# Patient Record
Sex: Male | Born: 1950 | Race: White | Hispanic: No | Marital: Married | Smoking: Never smoker
Health system: Southern US, Community
[De-identification: ages and names within clinical notes are randomized; demographics above are authoritative.]

## PROBLEM LIST (undated history)

## (undated) DIAGNOSIS — D229 Melanocytic nevi, unspecified: Secondary | ICD-10-CM

## (undated) DIAGNOSIS — R972 Elevated prostate specific antigen [PSA]: Secondary | ICD-10-CM

## (undated) DIAGNOSIS — L598 Other specified disorders of the skin and subcutaneous tissue related to radiation: Secondary | ICD-10-CM

## (undated) DIAGNOSIS — M272 Inflammatory conditions of jaws: Secondary | ICD-10-CM

## (undated) DIAGNOSIS — R5382 Chronic fatigue, unspecified: Secondary | ICD-10-CM

## (undated) DIAGNOSIS — N281 Cyst of kidney, acquired: Secondary | ICD-10-CM

## (undated) DIAGNOSIS — M1990 Unspecified osteoarthritis, unspecified site: Secondary | ICD-10-CM

## (undated) DIAGNOSIS — D4959 Neoplasm of unspecified behavior of other genitourinary organ: Secondary | ICD-10-CM

## (undated) DIAGNOSIS — H2510 Age-related nuclear cataract, unspecified eye: Secondary | ICD-10-CM

## (undated) DIAGNOSIS — H905 Unspecified sensorineural hearing loss: Secondary | ICD-10-CM

## (undated) DIAGNOSIS — E291 Testicular hypofunction: Secondary | ICD-10-CM

## (undated) DIAGNOSIS — D46C Myelodysplastic syndrome with isolated del(5q) chromosomal abnormality: Secondary | ICD-10-CM

## (undated) DIAGNOSIS — Y842 Radiological procedure and radiotherapy as the cause of abnormal reaction of the patient, or of later complication, without mention of misadventure at the time of the procedure: Secondary | ICD-10-CM

## (undated) DIAGNOSIS — N3941 Urge incontinence: Secondary | ICD-10-CM

## (undated) DIAGNOSIS — B370 Candidal stomatitis: Secondary | ICD-10-CM

## (undated) DIAGNOSIS — D099 Carcinoma in situ, unspecified: Secondary | ICD-10-CM

## (undated) DIAGNOSIS — H9313 Tinnitus, bilateral: Secondary | ICD-10-CM

## (undated) DIAGNOSIS — Z961 Presence of intraocular lens: Secondary | ICD-10-CM

## (undated) HISTORY — PX: ESOPHAGOSCOPY WITH DILITATION: SHX5618

---
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
## 2000-04-28 ENCOUNTER — Other Ambulatory Visit: Admission: RE | Admit: 2000-04-28 | Discharge: 2000-04-28 | Payer: Self-pay | Admitting: Specialist

## 2002-06-10 ENCOUNTER — Ambulatory Visit (HOSPITAL_BASED_OUTPATIENT_CLINIC_OR_DEPARTMENT_OTHER): Admission: RE | Admit: 2002-06-10 | Discharge: 2002-06-10 | Payer: Self-pay | Admitting: Orthopedic Surgery

## 2004-05-30 ENCOUNTER — Encounter: Admission: RE | Admit: 2004-05-30 | Discharge: 2004-05-30 | Payer: Self-pay | Admitting: Orthopedic Surgery

## 2008-02-06 ENCOUNTER — Ambulatory Visit: Payer: Self-pay | Admitting: Oncology

## 2008-02-26 LAB — CBC WITH DIFFERENTIAL/PLATELET
BASO%: 2 % (ref 0.0–2.0)
Basophils Absolute: 0.1 10*3/uL (ref 0.0–0.1)
EOS%: 5.8 % (ref 0.0–7.0)
HGB: 9.1 g/dL — ABNORMAL LOW (ref 13.0–17.1)
MCH: 40.2 pg — ABNORMAL HIGH (ref 28.0–33.4)
MONO#: 0.2 10*3/uL (ref 0.1–0.9)
RDW: 18 % — ABNORMAL HIGH (ref 11.2–14.6)
WBC: 5.1 10*3/uL (ref 4.0–10.0)
lymph#: 1.6 10*3/uL (ref 0.9–3.3)

## 2008-02-26 LAB — MORPHOLOGY: PLT EST: ADEQUATE

## 2008-02-28 ENCOUNTER — Ambulatory Visit (HOSPITAL_COMMUNITY): Admission: RE | Admit: 2008-02-28 | Discharge: 2008-02-28 | Payer: Self-pay | Admitting: Oncology

## 2008-02-28 ENCOUNTER — Encounter (HOSPITAL_COMMUNITY): Payer: Self-pay | Admitting: Oncology

## 2008-02-28 ENCOUNTER — Ambulatory Visit: Payer: Self-pay | Admitting: Oncology

## 2008-02-28 LAB — IMMUNOFIXATION ELECTROPHORESIS: IgG (Immunoglobin G), Serum: 1000 mg/dL (ref 694–1618)

## 2008-02-28 LAB — COMPREHENSIVE METABOLIC PANEL
ALT: 25 U/L (ref 0–53)
AST: 15 U/L (ref 0–37)
Albumin: 4.7 g/dL (ref 3.5–5.2)
Calcium: 9.6 mg/dL (ref 8.4–10.5)
Chloride: 104 mEq/L (ref 96–112)
Creatinine, Ser: 0.92 mg/dL (ref 0.40–1.50)
Potassium: 4.1 mEq/L (ref 3.5–5.3)

## 2008-02-28 LAB — KAPPA/LAMBDA LIGHT CHAINS: Kappa free light chain: 1.24 mg/dL (ref 0.33–1.94)

## 2008-03-07 LAB — CBC & DIFF AND RETIC
Basophils Absolute: 0.1 10*3/uL (ref 0.0–0.1)
HCT: 27.2 % — ABNORMAL LOW (ref 38.7–49.9)
HGB: 9.6 g/dL — ABNORMAL LOW (ref 13.0–17.1)
IRF: 0.42 — ABNORMAL HIGH (ref 0.070–0.380)
LYMPH%: 25.4 % (ref 14.0–48.0)
MONO#: 0.2 10*3/uL (ref 0.1–0.9)
NEUT%: 64.8 % (ref 40.0–75.0)
Platelets: 496 10*3/uL — ABNORMAL HIGH (ref 145–400)
WBC: 4.8 10*3/uL (ref 4.0–10.0)
lymph#: 1.2 10*3/uL (ref 0.9–3.3)

## 2008-03-07 LAB — ERYTHROCYTE SEDIMENTATION RATE: Sed Rate: 20 mm/hr (ref 0–20)

## 2008-03-13 LAB — HEMOCHROMATOSIS DNA-PCR(C282Y,H63D)

## 2008-03-18 ENCOUNTER — Ambulatory Visit: Payer: Self-pay | Admitting: Oncology

## 2008-03-21 LAB — CBC WITH DIFFERENTIAL/PLATELET
BASO%: 1.1 % (ref 0.0–2.0)
EOS%: 4.7 % (ref 0.0–7.0)
HCT: 27.9 % — ABNORMAL LOW (ref 38.7–49.9)
MCH: 40.1 pg — ABNORMAL HIGH (ref 28.0–33.4)
MCHC: 35.3 g/dL (ref 32.0–35.9)
NEUT%: 59.4 % (ref 40.0–75.0)
RBC: 2.45 10*6/uL — ABNORMAL LOW (ref 4.20–5.71)
RDW: 18 % — ABNORMAL HIGH (ref 11.2–14.6)
WBC: 4.3 10*3/uL (ref 4.0–10.0)
lymph#: 1.3 10*3/uL (ref 0.9–3.3)

## 2008-04-04 LAB — CBC WITH DIFFERENTIAL/PLATELET
Basophils Absolute: 0 10*3/uL (ref 0.0–0.1)
EOS%: 5 % (ref 0.0–7.0)
HCT: 23.4 % — ABNORMAL LOW (ref 38.7–49.9)
HGB: 8.2 g/dL — ABNORMAL LOW (ref 13.0–17.1)
MCH: 39.9 pg — ABNORMAL HIGH (ref 28.0–33.4)
MCV: 114.1 fL — ABNORMAL HIGH (ref 81.6–98.0)
MONO%: 3.7 % (ref 0.0–13.0)
NEUT%: 70.5 % (ref 40.0–75.0)
RDW: 17.4 % — ABNORMAL HIGH (ref 11.2–14.6)

## 2008-04-07 LAB — CBC WITH DIFFERENTIAL/PLATELET
EOS%: 4.7 % (ref 0.0–7.0)
MCH: 39.7 pg — ABNORMAL HIGH (ref 28.0–33.4)
MCV: 114 fL — ABNORMAL HIGH (ref 81.6–98.0)
MONO%: 5.4 % (ref 0.0–13.0)
RBC: 2.2 10*6/uL — ABNORMAL LOW (ref 4.20–5.71)
RDW: 17.8 % — ABNORMAL HIGH (ref 11.2–14.6)

## 2008-04-07 LAB — COMPREHENSIVE METABOLIC PANEL
ALT: 33 U/L (ref 0–53)
Alkaline Phosphatase: 64 U/L (ref 39–117)
Glucose, Bld: 101 mg/dL — ABNORMAL HIGH (ref 70–99)
Sodium: 137 mEq/L (ref 135–145)
Total Bilirubin: 0.3 mg/dL (ref 0.3–1.2)
Total Protein: 7.2 g/dL (ref 6.0–8.3)

## 2008-04-07 LAB — TECHNOLOGIST REVIEW

## 2008-04-11 LAB — CBC WITH DIFFERENTIAL/PLATELET
Basophils Absolute: 0 10*3/uL (ref 0.0–0.1)
Eosinophils Absolute: 0.3 10*3/uL (ref 0.0–0.5)
HGB: 8.6 g/dL — ABNORMAL LOW (ref 13.0–17.1)
LYMPH%: 32.7 % (ref 14.0–48.0)
MCV: 113.8 fL — ABNORMAL HIGH (ref 81.6–98.0)
MONO#: 0.2 10*3/uL (ref 0.1–0.9)
MONO%: 3.4 % (ref 0.0–13.0)
NEUT#: 2.9 10*3/uL (ref 1.5–6.5)
Platelets: 750 10*3/uL — ABNORMAL HIGH (ref 145–400)
RBC: 2.16 10*6/uL — ABNORMAL LOW (ref 4.20–5.71)
RDW: 17.9 % — ABNORMAL HIGH (ref 11.2–14.6)
WBC: 5 10*3/uL (ref 4.0–10.0)

## 2008-05-13 ENCOUNTER — Ambulatory Visit: Payer: Self-pay | Admitting: Oncology

## 2008-05-15 LAB — CBC WITH DIFFERENTIAL/PLATELET
Basophils Absolute: 0.6 10*3/uL — ABNORMAL HIGH (ref 0.0–0.1)
HCT: 29.6 % — ABNORMAL LOW (ref 38.7–49.9)
HGB: 10.7 g/dL — ABNORMAL LOW (ref 13.0–17.1)
MONO#: 0.2 10*3/uL (ref 0.1–0.9)
NEUT#: 1.9 10*3/uL (ref 1.5–6.5)
NEUT%: 40.3 % (ref 40.0–75.0)
RDW: 18.9 % — ABNORMAL HIGH (ref 11.2–14.6)
WBC: 4.7 10*3/uL (ref 4.0–10.0)
lymph#: 1.4 10*3/uL (ref 0.9–3.3)

## 2008-05-19 ENCOUNTER — Ambulatory Visit: Payer: Self-pay | Admitting: Oncology

## 2008-05-21 ENCOUNTER — Ambulatory Visit (HOSPITAL_COMMUNITY): Admission: RE | Admit: 2008-05-21 | Discharge: 2008-05-21 | Payer: Self-pay | Admitting: Oncology

## 2008-05-21 LAB — CBC WITH DIFFERENTIAL/PLATELET
Basophils Absolute: 0.5 10*3/uL — ABNORMAL HIGH (ref 0.0–0.1)
Eosinophils Absolute: 0.4 10*3/uL (ref 0.0–0.5)
HGB: 11.3 g/dL — ABNORMAL LOW (ref 13.0–17.1)
LYMPH%: 12.1 % — ABNORMAL LOW (ref 14.0–48.0)
MCV: 104 fL — ABNORMAL HIGH (ref 81.6–98.0)
MONO#: 0.3 10*3/uL (ref 0.1–0.9)
MONO%: 3.4 % (ref 0.0–13.0)
NEUT#: 7.2 10*3/uL — ABNORMAL HIGH (ref 1.5–6.5)
Platelets: 460 10*3/uL — ABNORMAL HIGH (ref 145–400)
RDW: 18.8 % — ABNORMAL HIGH (ref 11.2–14.6)
WBC: 9.7 10*3/uL (ref 4.0–10.0)

## 2008-05-21 LAB — BASIC METABOLIC PANEL
CO2: 28 mEq/L (ref 19–32)
Calcium: 9.5 mg/dL (ref 8.4–10.5)
Chloride: 98 mEq/L (ref 96–112)
Glucose, Bld: 131 mg/dL — ABNORMAL HIGH (ref 70–99)
Sodium: 134 mEq/L — ABNORMAL LOW (ref 135–145)

## 2008-05-21 LAB — TECHNOLOGIST REVIEW

## 2008-05-23 ENCOUNTER — Encounter (HOSPITAL_COMMUNITY): Payer: Self-pay | Admitting: Oncology

## 2008-05-23 ENCOUNTER — Inpatient Hospital Stay (HOSPITAL_COMMUNITY): Admission: AD | Admit: 2008-05-23 | Discharge: 2008-05-27 | Payer: Self-pay | Admitting: Oncology

## 2008-05-29 LAB — CBC WITH DIFFERENTIAL/PLATELET
BASO%: 16.3 % — ABNORMAL HIGH (ref 0.0–2.0)
Basophils Absolute: 0.7 10*3/uL — ABNORMAL HIGH (ref 0.0–0.1)
Eosinophils Absolute: 0.4 10*3/uL (ref 0.0–0.5)
HCT: 26.2 % — ABNORMAL LOW (ref 38.7–49.9)
HGB: 9.2 g/dL — ABNORMAL LOW (ref 13.0–17.1)
LYMPH%: 34.6 % (ref 14.0–48.0)
MCHC: 35.1 g/dL (ref 32.0–35.9)
MONO#: 0.1 10*3/uL (ref 0.1–0.9)
NEUT#: 1.4 10*3/uL — ABNORMAL LOW (ref 1.5–6.5)
NEUT%: 35.5 % — ABNORMAL LOW (ref 40.0–75.0)
Platelets: 500 10*3/uL — ABNORMAL HIGH (ref 145–400)
WBC: 4.1 10*3/uL (ref 4.0–10.0)
lymph#: 1.4 10*3/uL (ref 0.9–3.3)

## 2008-06-12 LAB — CBC WITH DIFFERENTIAL/PLATELET
Basophils Absolute: 0.6 10*3/uL — ABNORMAL HIGH (ref 0.0–0.1)
EOS%: 15.2 % — ABNORMAL HIGH (ref 0.0–7.0)
Eosinophils Absolute: 0.8 10*3/uL — ABNORMAL HIGH (ref 0.0–0.5)
HCT: 28.6 % — ABNORMAL LOW (ref 38.7–49.9)
HGB: 10 g/dL — ABNORMAL LOW (ref 13.0–17.1)
MCH: 37.1 pg — ABNORMAL HIGH (ref 28.0–33.4)
MCV: 107 fL — ABNORMAL HIGH (ref 81.6–98.0)
MONO%: 3.9 % (ref 0.0–13.0)
NEUT#: 1.8 10*3/uL (ref 1.5–6.5)
NEUT%: 36.2 % — ABNORMAL LOW (ref 40.0–75.0)
RDW: 19.4 % — ABNORMAL HIGH (ref 11.2–14.6)

## 2008-06-19 LAB — CBC WITH DIFFERENTIAL/PLATELET
Basophils Absolute: 0.3 10*3/uL — ABNORMAL HIGH (ref 0.0–0.1)
Eosinophils Absolute: 0.4 10*3/uL (ref 0.0–0.5)
HGB: 10.2 g/dL — ABNORMAL LOW (ref 13.0–17.1)
LYMPH%: 30.7 % (ref 14.0–48.0)
MCV: 107 fL — ABNORMAL HIGH (ref 81.6–98.0)
MONO#: 0.1 10*3/uL (ref 0.1–0.9)
MONO%: 4.5 % (ref 0.0–13.0)
NEUT#: 1.1 10*3/uL — ABNORMAL LOW (ref 1.5–6.5)
Platelets: 252 10*3/uL (ref 145–400)
RDW: 18.8 % — ABNORMAL HIGH (ref 11.2–14.6)
WBC: 2.8 10*3/uL — ABNORMAL LOW (ref 4.0–10.0)

## 2008-06-19 LAB — TECHNOLOGIST REVIEW

## 2008-06-26 LAB — CBC WITH DIFFERENTIAL/PLATELET
BASO%: 12.7 % — ABNORMAL HIGH (ref 0.0–2.0)
Basophils Absolute: 0.4 10*3/uL — ABNORMAL HIGH (ref 0.0–0.1)
HCT: 32.1 % — ABNORMAL LOW (ref 38.7–49.9)
HGB: 11.3 g/dL — ABNORMAL LOW (ref 13.0–17.1)
LYMPH%: 31 % (ref 14.0–48.0)
MCHC: 35.1 g/dL (ref 32.0–35.9)
MONO#: 0.3 10*3/uL (ref 0.1–0.9)
NEUT%: 40 % (ref 40.0–75.0)
Platelets: 253 10*3/uL (ref 145–400)
WBC: 3.3 10*3/uL — ABNORMAL LOW (ref 4.0–10.0)
lymph#: 1 10*3/uL (ref 0.9–3.3)

## 2008-06-26 LAB — COMPREHENSIVE METABOLIC PANEL
BUN: 13 mg/dL (ref 6–23)
CO2: 26 mEq/L (ref 19–32)
Calcium: 9.3 mg/dL (ref 8.4–10.5)
Chloride: 103 mEq/L (ref 96–112)
Creatinine, Ser: 1.02 mg/dL (ref 0.40–1.50)
Glucose, Bld: 76 mg/dL (ref 70–99)
Total Bilirubin: 0.5 mg/dL (ref 0.3–1.2)

## 2008-06-26 LAB — LACTATE DEHYDROGENASE: LDH: 170 U/L (ref 94–250)

## 2008-06-27 ENCOUNTER — Ambulatory Visit (HOSPITAL_COMMUNITY): Admission: RE | Admit: 2008-06-27 | Discharge: 2008-06-27 | Payer: Self-pay | Admitting: Oncology

## 2008-07-08 ENCOUNTER — Ambulatory Visit: Payer: Self-pay | Admitting: Oncology

## 2008-07-10 LAB — CBC WITH DIFFERENTIAL/PLATELET
HGB: 13.1 g/dL (ref 13.0–17.1)
MCV: 109.1 fL — ABNORMAL HIGH (ref 81.6–98.0)
RBC: 3.47 10*6/uL — ABNORMAL LOW (ref 4.20–5.71)
RDW: 18.8 % — ABNORMAL HIGH (ref 11.2–14.6)
WBC: 3.4 10*3/uL — ABNORMAL LOW (ref 4.0–10.0)

## 2008-07-10 LAB — MANUAL DIFFERENTIAL
ALC: 1.2 10*3/uL (ref 0.9–3.3)
Band Neutrophils: 0 % (ref 0–10)
Blasts: 0 % (ref 0–0)
EOS: 6 % (ref 0–7)
Myelocytes: 0 % (ref 0–0)
Other Cell: 0 % (ref 0–0)
PROMYELO: 0 % (ref 0–0)
SEG: 45 % (ref 38–77)
Variant Lymph: 0 % (ref 0–0)
nRBC: 0 % (ref 0–0)

## 2008-07-28 ENCOUNTER — Ambulatory Visit (HOSPITAL_COMMUNITY): Admission: RE | Admit: 2008-07-28 | Discharge: 2008-07-28 | Payer: Self-pay | Admitting: Oncology

## 2008-07-29 LAB — COMPREHENSIVE METABOLIC PANEL
ALT: 43 U/L (ref 0–53)
AST: 38 U/L — ABNORMAL HIGH (ref 0–37)
Albumin: 4.6 g/dL (ref 3.5–5.2)
Alkaline Phosphatase: 43 U/L (ref 39–117)
BUN: 15 mg/dL (ref 6–23)
CO2: 26 mEq/L (ref 19–32)
Calcium: 9.4 mg/dL (ref 8.4–10.5)
Chloride: 103 mEq/L (ref 96–112)
Creatinine, Ser: 0.96 mg/dL (ref 0.40–1.50)
Glucose, Bld: 101 mg/dL — ABNORMAL HIGH (ref 70–99)
Potassium: 4.2 mEq/L (ref 3.5–5.3)
Sodium: 140 mEq/L (ref 135–145)
Total Bilirubin: 0.7 mg/dL (ref 0.3–1.2)
Total Protein: 7.3 g/dL (ref 6.0–8.3)

## 2008-07-29 LAB — CBC WITH DIFFERENTIAL/PLATELET
BASO%: 5.3 % — ABNORMAL HIGH (ref 0.0–2.0)
Basophils Absolute: 0.2 10*3/uL — ABNORMAL HIGH (ref 0.0–0.1)
EOS%: 5.8 % (ref 0.0–7.0)
HCT: 37.7 % — ABNORMAL LOW (ref 38.7–49.9)
HGB: 13.4 g/dL (ref 13.0–17.1)
LYMPH%: 41.5 % (ref 14.0–48.0)
MCH: 36.6 pg — ABNORMAL HIGH (ref 28.0–33.4)
MCHC: 35.5 g/dL (ref 32.0–35.9)
MCV: 103 fL — ABNORMAL HIGH (ref 81.6–98.0)
MONO%: 7.3 % (ref 0.0–13.0)
NEUT%: 40 % (ref 40.0–75.0)
Platelets: 258 10*3/uL (ref 145–400)
lymph#: 1.4 10*3/uL (ref 0.9–3.3)

## 2008-07-29 LAB — TECHNOLOGIST REVIEW

## 2008-08-07 LAB — CBC WITH DIFFERENTIAL/PLATELET
BASO%: 1.1 % (ref 0.0–2.0)
LYMPH%: 33.3 % (ref 14.0–48.0)
MCHC: 34.8 g/dL (ref 32.0–35.9)
MONO#: 0.2 10*3/uL (ref 0.1–0.9)
MONO%: 6.1 % (ref 0.0–13.0)
NEUT#: 2.3 10*3/uL (ref 1.5–6.5)
Platelets: 200 10*3/uL (ref 145–400)
RBC: 3.66 10*6/uL — ABNORMAL LOW (ref 4.20–5.71)
RDW: 16.7 % — ABNORMAL HIGH (ref 11.2–14.6)
WBC: 4 10*3/uL (ref 4.0–10.0)

## 2008-08-07 LAB — TECHNOLOGIST REVIEW

## 2008-08-19 LAB — CBC WITH DIFFERENTIAL/PLATELET
BASO%: 7.8 % — ABNORMAL HIGH (ref 0.0–2.0)
EOS%: 5 % (ref 0.0–7.0)
HGB: 13.4 g/dL (ref 13.0–17.1)
MCH: 36 pg — ABNORMAL HIGH (ref 28.0–33.4)
MCHC: 35.6 g/dL (ref 32.0–35.9)
MONO#: 0.2 10*3/uL (ref 0.1–0.9)
RDW: 14.8 % — ABNORMAL HIGH (ref 11.2–14.6)
WBC: 4 10*3/uL (ref 4.0–10.0)
lymph#: 1.4 10*3/uL (ref 0.9–3.3)

## 2008-09-17 ENCOUNTER — Ambulatory Visit: Payer: Self-pay | Admitting: Oncology

## 2008-09-19 ENCOUNTER — Ambulatory Visit (HOSPITAL_COMMUNITY): Admission: RE | Admit: 2008-09-19 | Discharge: 2008-09-19 | Payer: Self-pay | Admitting: Oncology

## 2008-09-19 LAB — CBC WITH DIFFERENTIAL/PLATELET
Eosinophils Absolute: 0.1 10*3/uL (ref 0.0–0.5)
HCT: 40.7 % (ref 38.7–49.9)
LYMPH%: 38.3 % (ref 14.0–48.0)
MCV: 104.2 fL — ABNORMAL HIGH (ref 81.6–98.0)
MONO#: 0.2 10*3/uL (ref 0.1–0.9)
MONO%: 4.5 % (ref 0.0–13.0)
NEUT#: 2.4 10*3/uL (ref 1.5–6.5)
NEUT%: 53 % (ref 40.0–75.0)
Platelets: 201 10*3/uL (ref 145–400)
RBC: 3.91 10*6/uL — ABNORMAL LOW (ref 4.20–5.71)
WBC: 4.4 10*3/uL (ref 4.0–10.0)

## 2008-09-19 LAB — TECHNOLOGIST REVIEW

## 2008-10-14 LAB — COMPREHENSIVE METABOLIC PANEL
ALT: 31 U/L (ref 0–53)
AST: 23 U/L (ref 0–37)
Albumin: 4.5 g/dL (ref 3.5–5.2)
Calcium: 9.3 mg/dL (ref 8.4–10.5)
Chloride: 102 mEq/L (ref 96–112)
Potassium: 4.4 mEq/L (ref 3.5–5.3)
Total Protein: 7 g/dL (ref 6.0–8.3)

## 2008-10-14 LAB — CBC WITH DIFFERENTIAL/PLATELET
BASO%: 1.8 % (ref 0.0–2.0)
HCT: 37.6 % — ABNORMAL LOW (ref 38.7–49.9)
MCHC: 35 g/dL (ref 32.0–35.9)
MONO#: 0.1 10*3/uL (ref 0.1–0.9)
NEUT#: 1.9 10*3/uL (ref 1.5–6.5)
RBC: 3.66 10*6/uL — ABNORMAL LOW (ref 4.20–5.71)
WBC: 3.8 10*3/uL — ABNORMAL LOW (ref 4.0–10.0)
lymph#: 1.5 10*3/uL (ref 0.9–3.3)

## 2008-10-14 LAB — TECHNOLOGIST REVIEW

## 2008-11-19 ENCOUNTER — Ambulatory Visit: Payer: Self-pay | Admitting: Oncology

## 2008-11-21 LAB — CBC WITH DIFFERENTIAL/PLATELET
BASO%: 7 % — ABNORMAL HIGH (ref 0.0–2.0)
HCT: 30.9 % — ABNORMAL LOW (ref 38.4–49.9)
MCHC: 34.6 g/dL (ref 32.0–36.0)
MONO#: 0.2 10*3/uL (ref 0.1–0.9)
NEUT%: 39.6 % (ref 39.0–75.0)
WBC: 3 10*3/uL — ABNORMAL LOW (ref 4.0–10.3)
lymph#: 1.3 10*3/uL (ref 0.9–3.3)
nRBC: 0 % (ref 0–0)

## 2008-11-21 LAB — COMPREHENSIVE METABOLIC PANEL
ALT: 30 U/L (ref 0–53)
AST: 20 U/L (ref 0–37)
Albumin: 4.7 g/dL (ref 3.5–5.2)
BUN: 14 mg/dL (ref 6–23)
Calcium: 9.3 mg/dL (ref 8.4–10.5)
Chloride: 104 mEq/L (ref 96–112)
Potassium: 4.1 mEq/L (ref 3.5–5.3)
Total Protein: 7.2 g/dL (ref 6.0–8.3)

## 2008-11-25 ENCOUNTER — Ambulatory Visit (HOSPITAL_COMMUNITY): Admission: RE | Admit: 2008-11-25 | Discharge: 2008-11-25 | Payer: Self-pay | Admitting: Oncology

## 2008-11-25 LAB — CBC WITH DIFFERENTIAL/PLATELET
BASO%: 8.5 % — ABNORMAL HIGH (ref 0.0–2.0)
Basophils Absolute: 0.3 10*3/uL — ABNORMAL HIGH (ref 0.0–0.1)
EOS%: 5 % (ref 0.0–7.0)
HGB: 10.1 g/dL — ABNORMAL LOW (ref 13.0–17.1)
MCH: 35.8 pg — ABNORMAL HIGH (ref 27.2–33.4)
MONO#: 0.2 10*3/uL (ref 0.1–0.9)
RDW: 18.7 % — ABNORMAL HIGH (ref 11.0–14.6)
WBC: 3.4 10*3/uL — ABNORMAL LOW (ref 4.0–10.3)
lymph#: 1.3 10*3/uL (ref 0.9–3.3)

## 2008-11-25 LAB — TECHNOLOGIST REVIEW: Technologist Review: 1

## 2008-11-26 ENCOUNTER — Ambulatory Visit (HOSPITAL_COMMUNITY): Admission: RE | Admit: 2008-11-26 | Discharge: 2008-11-26 | Payer: Self-pay | Admitting: Oncology

## 2008-12-10 LAB — CBC WITH DIFFERENTIAL/PLATELET
BASO%: 7.5 % — ABNORMAL HIGH (ref 0.0–2.0)
EOS%: 7.5 % — ABNORMAL HIGH (ref 0.0–7.0)
MCH: 35.7 pg — ABNORMAL HIGH (ref 27.2–33.4)
MCHC: 35.9 g/dL (ref 32.0–36.0)
MCV: 99.6 fL — ABNORMAL HIGH (ref 79.3–98.0)
MONO%: 3 % (ref 0.0–14.0)
NEUT#: 1.9 10*3/uL (ref 1.5–6.5)
RBC: 2.35 10*6/uL — ABNORMAL LOW (ref 4.20–5.82)
RDW: 17.6 % — ABNORMAL HIGH (ref 11.0–14.6)

## 2008-12-11 ENCOUNTER — Encounter (HOSPITAL_COMMUNITY): Admission: RE | Admit: 2008-12-11 | Discharge: 2009-01-01 | Payer: Self-pay | Admitting: Oncology

## 2008-12-11 LAB — CHCC SMEAR

## 2008-12-11 LAB — TYPE & CROSSMATCH - CHCC

## 2008-12-17 LAB — CBC WITH DIFFERENTIAL/PLATELET
BASO%: 7.2 % — ABNORMAL HIGH (ref 0.0–2.0)
EOS%: 7 % (ref 0.0–7.0)
MCH: 34.4 pg — ABNORMAL HIGH (ref 27.2–33.4)
MCHC: 35.7 g/dL (ref 32.0–36.0)
MCV: 96.3 fL (ref 79.3–98.0)
MONO%: 3.2 % (ref 0.0–14.0)
RDW: 17.8 % — ABNORMAL HIGH (ref 11.0–14.6)
lymph#: 1.6 10*3/uL (ref 0.9–3.3)

## 2008-12-24 LAB — CBC WITH DIFFERENTIAL/PLATELET
BASO%: 7.9 % — ABNORMAL HIGH (ref 0.0–2.0)
Basophils Absolute: 0.4 10*3/uL — ABNORMAL HIGH (ref 0.0–0.1)
HCT: 22.6 % — ABNORMAL LOW (ref 38.4–49.9)
HGB: 7.9 g/dL — ABNORMAL LOW (ref 13.0–17.1)
MCHC: 35 g/dL (ref 32.0–36.0)
MONO#: 0.2 10*3/uL (ref 0.1–0.9)
NEUT#: 2.3 10*3/uL (ref 1.5–6.5)
NEUT%: 49.8 % (ref 39.0–75.0)
WBC: 4.6 10*3/uL (ref 4.0–10.3)
lymph#: 1.4 10*3/uL (ref 0.9–3.3)

## 2008-12-25 LAB — TYPE & CROSSMATCH - CHCC

## 2008-12-29 ENCOUNTER — Ambulatory Visit: Payer: Self-pay | Admitting: Oncology

## 2009-01-07 LAB — CBC WITH DIFFERENTIAL/PLATELET
Eosinophils Absolute: 0.2 10*3/uL (ref 0.0–0.5)
MONO#: 0.2 10*3/uL (ref 0.1–0.9)
NEUT#: 1.6 10*3/uL (ref 1.5–6.5)
RBC: 2.64 10*6/uL — ABNORMAL LOW (ref 4.20–5.82)
RDW: 17.5 % — ABNORMAL HIGH (ref 11.0–14.6)
WBC: 3.8 10*3/uL — ABNORMAL LOW (ref 4.0–10.3)
nRBC: 0 % (ref 0–0)

## 2009-01-07 LAB — TECHNOLOGIST REVIEW

## 2009-01-08 ENCOUNTER — Encounter (HOSPITAL_COMMUNITY): Admission: RE | Admit: 2009-01-08 | Discharge: 2009-02-25 | Payer: Self-pay | Admitting: Oncology

## 2009-01-16 LAB — CBC WITH DIFFERENTIAL/PLATELET
Eosinophils Absolute: 0.1 10*3/uL (ref 0.0–0.5)
MCV: 88.5 fL (ref 79.3–98.0)
MONO#: 0.1 10*3/uL (ref 0.1–0.9)
MONO%: 3.1 % (ref 0.0–14.0)
NEUT#: 1.6 10*3/uL (ref 1.5–6.5)
RBC: 3.04 10*6/uL — ABNORMAL LOW (ref 4.20–5.82)
RDW: 16.6 % — ABNORMAL HIGH (ref 11.0–14.6)
WBC: 3.8 10*3/uL — ABNORMAL LOW (ref 4.0–10.3)
nRBC: 0 % (ref 0–0)

## 2009-01-16 LAB — COMPREHENSIVE METABOLIC PANEL
ALT: 31 U/L (ref 0–53)
AST: 18 U/L (ref 0–37)
Alkaline Phosphatase: 63 U/L (ref 39–117)
BUN: 18 mg/dL (ref 6–23)
Chloride: 104 mEq/L (ref 96–112)
Creatinine, Ser: 1.03 mg/dL (ref 0.40–1.50)

## 2009-01-16 LAB — TECHNOLOGIST REVIEW

## 2009-01-20 LAB — COMPREHENSIVE METABOLIC PANEL
Albumin: 4.3 g/dL (ref 3.5–5.2)
Alkaline Phosphatase: 66 U/L (ref 39–117)
BUN: 17 mg/dL (ref 6–23)
Creatinine, Ser: 1.04 mg/dL (ref 0.40–1.50)
Glucose, Bld: 114 mg/dL — ABNORMAL HIGH (ref 70–99)
Potassium: 4.3 mEq/L (ref 3.5–5.3)
Total Bilirubin: 0.4 mg/dL (ref 0.3–1.2)

## 2009-01-20 LAB — CBC WITH DIFFERENTIAL/PLATELET
Basophils Absolute: 0 10*3/uL (ref 0.0–0.1)
EOS%: 3.1 % (ref 0.0–7.0)
Eosinophils Absolute: 0.1 10*3/uL (ref 0.0–0.5)
HCT: 24.6 % — ABNORMAL LOW (ref 38.4–49.9)
HGB: 8.5 g/dL — ABNORMAL LOW (ref 13.0–17.1)
LYMPH%: 42.6 % (ref 14.0–49.0)
MCH: 31.8 pg (ref 27.2–33.4)
MCV: 91.8 fL (ref 79.3–98.0)
MONO%: 2.3 % (ref 0.0–14.0)
NEUT#: 1.9 10*3/uL (ref 1.5–6.5)
NEUT%: 51.3 % (ref 39.0–75.0)
Platelets: 153 10*3/uL (ref 140–400)

## 2009-01-27 LAB — CBC WITH DIFFERENTIAL/PLATELET
BASO%: 4.9 % — ABNORMAL HIGH (ref 0.0–2.0)
EOS%: 5.6 % (ref 0.0–7.0)
HGB: 9.1 g/dL — ABNORMAL LOW (ref 13.0–17.1)
MCH: 29.6 pg (ref 27.2–33.4)
MCHC: 33.5 g/dL (ref 32.0–36.0)
RDW: 17.3 % — ABNORMAL HIGH (ref 11.0–14.6)
lymph#: 1.8 10*3/uL (ref 0.9–3.3)
nRBC: 0 % (ref 0–0)

## 2009-02-03 LAB — CBC WITH DIFFERENTIAL/PLATELET
Eosinophils Absolute: 0.1 10*3/uL (ref 0.0–0.5)
MONO#: 0.1 10*3/uL (ref 0.1–0.9)
NEUT#: 2.2 10*3/uL (ref 1.5–6.5)
RBC: 2.91 10*6/uL — ABNORMAL LOW (ref 4.20–5.82)
RDW: 17.9 % — ABNORMAL HIGH (ref 11.0–14.6)
WBC: 3.7 10*3/uL — ABNORMAL LOW (ref 4.0–10.3)

## 2009-02-03 LAB — HOLD TUBE, BLOOD BANK

## 2009-02-04 LAB — TYPE & CROSSMATCH - CHCC

## 2009-02-13 ENCOUNTER — Ambulatory Visit: Payer: Self-pay | Admitting: Oncology

## 2009-02-24 LAB — CBC WITH DIFFERENTIAL/PLATELET
BASO%: 3 % — ABNORMAL HIGH (ref 0.0–2.0)
Basophils Absolute: 0.1 10*3/uL (ref 0.0–0.1)
EOS%: 3 % (ref 0.0–7.0)
HGB: 9.5 g/dL — ABNORMAL LOW (ref 13.0–17.1)
MCH: 29 pg (ref 27.2–33.4)
MCHC: 33.7 g/dL (ref 32.0–36.0)
RDW: 16.4 % — ABNORMAL HIGH (ref 11.0–14.6)
WBC: 3.4 10*3/uL — ABNORMAL LOW (ref 4.0–10.3)
lymph#: 1.1 10*3/uL (ref 0.9–3.3)

## 2009-03-03 LAB — CBC WITH DIFFERENTIAL/PLATELET
BASO%: 2.9 % — ABNORMAL HIGH (ref 0.0–2.0)
Basophils Absolute: 0.1 10*3/uL (ref 0.0–0.1)
HCT: 25.8 % — ABNORMAL LOW (ref 38.4–49.9)
HGB: 8.6 g/dL — ABNORMAL LOW (ref 13.0–17.1)
MONO#: 0.3 10*3/uL (ref 0.1–0.9)
NEUT#: 2.5 10*3/uL (ref 1.5–6.5)
NEUT%: 59.9 % (ref 39.0–75.0)
RDW: 16.6 % — ABNORMAL HIGH (ref 11.0–14.6)
WBC: 4.1 10*3/uL (ref 4.0–10.3)
lymph#: 1.1 10*3/uL (ref 0.9–3.3)

## 2009-03-10 LAB — CBC WITH DIFFERENTIAL/PLATELET
Basophils Absolute: 0.1 10*3/uL (ref 0.0–0.1)
EOS%: 5.4 % (ref 0.0–7.0)
Eosinophils Absolute: 0.2 10*3/uL (ref 0.0–0.5)
HCT: 30.1 % — ABNORMAL LOW (ref 38.4–49.9)
HGB: 10.3 g/dL — ABNORMAL LOW (ref 13.0–17.1)
MCH: 30 pg (ref 27.2–33.4)
MCV: 87.8 fL (ref 79.3–98.0)
NEUT#: 2.3 10*3/uL (ref 1.5–6.5)
NEUT%: 61.3 % (ref 39.0–75.0)
lymph#: 0.9 10*3/uL (ref 0.9–3.3)

## 2009-03-17 ENCOUNTER — Ambulatory Visit: Payer: Self-pay | Admitting: Oncology

## 2009-03-19 LAB — IRON AND TIBC: UIBC: <55 ug/dL

## 2009-03-19 LAB — CBC WITH DIFFERENTIAL/PLATELET
Basophils Absolute: 0.1 10*3/uL (ref 0.0–0.1)
Eosinophils Absolute: 0.1 10*3/uL (ref 0.0–0.5)
HCT: 30.8 % — ABNORMAL LOW (ref 38.4–49.9)
LYMPH%: 21.5 % (ref 14.0–49.0)
MCV: 86.5 fL (ref 79.3–98.0)
MONO#: 0.2 10*3/uL (ref 0.1–0.9)
MONO%: 6.3 % (ref 0.0–14.0)
NEUT#: 2.1 10*3/uL (ref 1.5–6.5)
NEUT%: 64.7 % (ref 39.0–75.0)
Platelets: 170 10*3/uL (ref 140–400)
RBC: 3.56 10*6/uL — ABNORMAL LOW (ref 4.20–5.82)
WBC: 3.3 10*3/uL — ABNORMAL LOW (ref 4.0–10.3)
nRBC: 0 % (ref 0–0)

## 2009-03-19 LAB — COMPREHENSIVE METABOLIC PANEL
ALT: 57 U/L — ABNORMAL HIGH (ref 0–53)
Albumin: 4.4 g/dL (ref 3.5–5.2)
BUN: 17 mg/dL (ref 6–23)
CO2: 23 mEq/L (ref 19–32)
Calcium: 9.4 mg/dL (ref 8.4–10.5)
Glucose, Bld: 173 mg/dL — ABNORMAL HIGH (ref 70–99)
Sodium: 137 mEq/L (ref 135–145)
Total Bilirubin: 0.8 mg/dL (ref 0.3–1.2)

## 2009-03-19 LAB — FERRITIN: Ferritin: 1901 ng/mL — ABNORMAL HIGH (ref 22–322)

## 2009-03-24 LAB — CBC WITH DIFFERENTIAL/PLATELET
BASO%: 5.5 % — ABNORMAL HIGH (ref 0.0–2.0)
LYMPH%: 22.7 % (ref 14.0–49.0)
MCHC: 34.6 g/dL (ref 32.0–36.0)
MONO#: 0.2 10*3/uL (ref 0.1–0.9)
MONO%: 5.3 % (ref 0.0–14.0)
Platelets: 213 10*3/uL (ref 140–400)
RBC: 3.29 10*6/uL — ABNORMAL LOW (ref 4.20–5.82)
WBC: 4.4 10*3/uL (ref 4.0–10.3)
nRBC: 0 % (ref 0–0)

## 2009-03-31 LAB — CBC WITH DIFFERENTIAL/PLATELET
BASO%: 2.6 % — ABNORMAL HIGH (ref 0.0–2.0)
Eosinophils Absolute: 0.2 10*3/uL (ref 0.0–0.5)
LYMPH%: 32.6 % (ref 14.0–49.0)
MCHC: 35 g/dL (ref 32.0–36.0)
MONO#: 0.2 10*3/uL (ref 0.1–0.9)
NEUT#: 2.5 10*3/uL (ref 1.5–6.5)
RBC: 2.82 10*6/uL — ABNORMAL LOW (ref 4.20–5.82)
RDW: 15.6 % — ABNORMAL HIGH (ref 11.0–14.6)
WBC: 4.5 10*3/uL (ref 4.0–10.3)

## 2009-04-01 ENCOUNTER — Encounter (HOSPITAL_COMMUNITY): Admission: RE | Admit: 2009-04-01 | Discharge: 2009-05-28 | Payer: Self-pay | Admitting: Oncology

## 2009-04-01 LAB — TYPE & CROSSMATCH - CHCC

## 2009-04-07 LAB — CBC WITH DIFFERENTIAL/PLATELET
BASO%: 2.7 % — ABNORMAL HIGH (ref 0.0–2.0)
Basophils Absolute: 0.1 10*3/uL (ref 0.0–0.1)
EOS%: 5.2 % (ref 0.0–7.0)
HCT: 28.4 % — ABNORMAL LOW (ref 38.4–49.9)
HGB: 10 g/dL — ABNORMAL LOW (ref 13.0–17.1)
MCH: 31.2 pg (ref 27.2–33.4)
MCHC: 35.1 g/dL (ref 32.0–36.0)
MONO#: 0.1 10*3/uL (ref 0.1–0.9)
NEUT%: 49.4 % (ref 39.0–75.0)
RDW: 14.7 % — ABNORMAL HIGH (ref 11.0–14.6)
WBC: 3.6 10*3/uL — ABNORMAL LOW (ref 4.0–10.3)
lymph#: 1.4 10*3/uL (ref 0.9–3.3)

## 2009-04-07 LAB — TECHNOLOGIST REVIEW

## 2009-04-07 LAB — HOLD TUBE, BLOOD BANK

## 2009-04-13 LAB — CBC WITH DIFFERENTIAL/PLATELET
Basophils Absolute: 0 10*3/uL (ref 0.0–0.1)
Eosinophils Absolute: 0.2 10*3/uL (ref 0.0–0.5)
HGB: 9.1 g/dL — ABNORMAL LOW (ref 13.0–17.1)
MCV: 90.6 fL (ref 79.3–98.0)
MONO#: 0.1 10*3/uL (ref 0.1–0.9)
NEUT#: 1.8 10*3/uL (ref 1.5–6.5)
RDW: 15 % — ABNORMAL HIGH (ref 11.0–14.6)
WBC: 3.3 10*3/uL — ABNORMAL LOW (ref 4.0–10.3)
lymph#: 1.3 10*3/uL (ref 0.9–3.3)

## 2009-04-14 LAB — TYPE & CROSSMATCH - CHCC

## 2009-04-17 ENCOUNTER — Ambulatory Visit: Payer: Self-pay | Admitting: Oncology

## 2009-05-01 LAB — CBC WITH DIFFERENTIAL/PLATELET
Basophils Absolute: 0.3 10*3/uL — ABNORMAL HIGH (ref 0.0–0.1)
EOS%: 6.7 % (ref 0.0–7.0)
Eosinophils Absolute: 0.2 10*3/uL (ref 0.0–0.5)
HGB: 8.3 g/dL — ABNORMAL LOW (ref 13.0–17.1)
NEUT#: 1.9 10*3/uL (ref 1.5–6.5)
RDW: 18.4 % — ABNORMAL HIGH (ref 11.0–14.6)
lymph#: 1.1 10*3/uL (ref 0.9–3.3)

## 2009-05-01 LAB — HOLD TUBE, BLOOD BANK

## 2009-05-01 LAB — COMPREHENSIVE METABOLIC PANEL
Albumin: 4 g/dL (ref 3.5–5.2)
Alkaline Phosphatase: 66 U/L (ref 39–117)
BUN: 18 mg/dL (ref 6–23)
Calcium: 8.8 mg/dL (ref 8.4–10.5)
Chloride: 108 mEq/L (ref 96–112)
Glucose, Bld: 194 mg/dL — ABNORMAL HIGH (ref 70–99)
Potassium: 4.3 mEq/L (ref 3.5–5.3)
Sodium: 138 mEq/L (ref 135–145)
Total Protein: 6.8 g/dL (ref 6.0–8.3)

## 2009-05-01 LAB — TYPE & CROSSMATCH - CHCC

## 2009-05-01 LAB — LACTATE DEHYDROGENASE: LDH: 177 U/L (ref 94–250)

## 2009-05-06 LAB — CBC WITH DIFFERENTIAL/PLATELET
BASO%: 6.1 % — ABNORMAL HIGH (ref 0.0–2.0)
EOS%: 7.7 % — ABNORMAL HIGH (ref 0.0–7.0)
HCT: 29.9 % — ABNORMAL LOW (ref 38.4–49.9)
LYMPH%: 29.8 % (ref 14.0–49.0)
MCH: 30.4 pg (ref 27.2–33.4)
MCHC: 34.1 g/dL (ref 32.0–36.0)
NEUT%: 51.3 % (ref 39.0–75.0)
Platelets: 219 10*3/uL (ref 140–400)
RBC: 3.35 10*6/uL — ABNORMAL LOW (ref 4.20–5.82)
lymph#: 1.5 10*3/uL (ref 0.9–3.3)
nRBC: 0 % (ref 0–0)

## 2009-05-18 ENCOUNTER — Ambulatory Visit: Payer: Self-pay | Admitting: Oncology

## 2009-05-26 LAB — CBC WITH DIFFERENTIAL/PLATELET
Basophils Absolute: 0.3 10*3/uL — ABNORMAL HIGH (ref 0.0–0.1)
EOS%: 8.3 % — ABNORMAL HIGH (ref 0.0–7.0)
HCT: 25.6 % — ABNORMAL LOW (ref 38.4–49.9)
HGB: 8.8 g/dL — ABNORMAL LOW (ref 13.0–17.1)
LYMPH%: 30.2 % (ref 14.0–49.0)
MCH: 30.8 pg (ref 27.2–33.4)
MCHC: 34.4 g/dL (ref 32.0–36.0)
MCV: 89.5 fL (ref 79.3–98.0)
MONO%: 2.8 % (ref 0.0–14.0)
NEUT%: 51 % (ref 39.0–75.0)

## 2009-05-26 LAB — COMPREHENSIVE METABOLIC PANEL
Albumin: 4.1 g/dL (ref 3.5–5.2)
Alkaline Phosphatase: 63 U/L (ref 39–117)
BUN: 17 mg/dL (ref 6–23)
Creatinine, Ser: 0.92 mg/dL (ref 0.40–1.50)
Glucose, Bld: 147 mg/dL — ABNORMAL HIGH (ref 70–99)
Total Bilirubin: 0.3 mg/dL (ref 0.3–1.2)

## 2009-05-26 LAB — TYPE & CROSSMATCH - CHCC

## 2009-05-26 LAB — FERRITIN: Ferritin: 2000 ng/mL — ABNORMAL HIGH (ref 22–322)

## 2009-06-02 LAB — HOLD TUBE, BLOOD BANK

## 2009-06-02 LAB — CBC WITH DIFFERENTIAL/PLATELET
Basophils Absolute: 0.3 10*3/uL — ABNORMAL HIGH (ref 0.0–0.1)
Eosinophils Absolute: 0.3 10*3/uL (ref 0.0–0.5)
HCT: 30.9 % — ABNORMAL LOW (ref 38.4–49.9)
HGB: 10.6 g/dL — ABNORMAL LOW (ref 13.0–17.1)
MONO#: 0.2 10*3/uL (ref 0.1–0.9)
NEUT#: 1.8 10*3/uL (ref 1.5–6.5)
NEUT%: 50.3 % (ref 39.0–75.0)
WBC: 3.5 10*3/uL — ABNORMAL LOW (ref 4.0–10.3)
lymph#: 1 10*3/uL (ref 0.9–3.3)

## 2009-06-08 LAB — CBC WITH DIFFERENTIAL/PLATELET
HCT: 28 % — ABNORMAL LOW (ref 38.4–49.9)
HGB: 9.7 g/dL — ABNORMAL LOW (ref 13.0–17.1)
MCH: 30.5 pg (ref 27.2–33.4)
MCV: 88.1 fL (ref 79.3–98.0)

## 2009-06-08 LAB — MANUAL DIFFERENTIAL
Band Neutrophils: 0 % (ref 0–10)
EOS: 6 % (ref 0–7)
MONO: 2 % (ref 0–14)
Other Cell: 0 % (ref 0–0)
PLT EST: ADEQUATE
PROMYELO: 0 % (ref 0–0)
SEG: 60 % (ref 38–77)
Variant Lymph: 0 % (ref 0–0)
nRBC: 0 % (ref 0–0)

## 2009-06-16 ENCOUNTER — Encounter (HOSPITAL_COMMUNITY): Admission: RE | Admit: 2009-06-16 | Discharge: 2009-08-28 | Payer: Self-pay | Admitting: Oncology

## 2009-06-16 LAB — CBC WITH DIFFERENTIAL/PLATELET
BASO%: 7.7 % — ABNORMAL HIGH (ref 0.0–2.0)
Basophils Absolute: 0.3 10*3/uL — ABNORMAL HIGH (ref 0.0–0.1)
EOS%: 8 % — ABNORMAL HIGH (ref 0.0–7.0)
Eosinophils Absolute: 0.3 10*3/uL (ref 0.0–0.5)
HCT: 23.3 % — ABNORMAL LOW (ref 38.4–49.9)
HGB: 7.9 g/dL — ABNORMAL LOW (ref 13.0–17.1)
LYMPH%: 33 % (ref 14.0–49.0)
MCH: 30.2 pg (ref 27.2–33.4)
MCHC: 33.9 g/dL (ref 32.0–36.0)
MCV: 88.9 fL (ref 79.3–98.0)
MONO#: 0.1 10*3/uL (ref 0.1–0.9)
MONO%: 4.3 % (ref 0.0–14.0)
NEUT#: 1.5 10*3/uL (ref 1.5–6.5)
NEUT%: 47 % (ref 39.0–75.0)
Platelets: 162 10*3/uL (ref 140–400)
RBC: 2.62 10*6/uL — ABNORMAL LOW (ref 4.20–5.82)
RDW: 17.2 % — ABNORMAL HIGH (ref 11.0–14.6)
WBC: 3.2 10*3/uL — ABNORMAL LOW (ref 4.0–10.3)
lymph#: 1.1 10*3/uL (ref 0.9–3.3)
nRBC: 0 % (ref 0–0)

## 2009-06-18 ENCOUNTER — Ambulatory Visit: Payer: Self-pay | Admitting: Oncology

## 2009-06-22 LAB — CBC WITH DIFFERENTIAL/PLATELET
Eosinophils Absolute: 0.3 10*3/uL (ref 0.0–0.5)
HCT: 28 % — ABNORMAL LOW (ref 38.4–49.9)
LYMPH%: 29.8 % (ref 14.0–49.0)
MCHC: 33.9 g/dL (ref 32.0–36.0)
MCV: 90 fL (ref 79.3–98.0)
MONO#: 0.1 10*3/uL (ref 0.1–0.9)
MONO%: 4 % (ref 0.0–14.0)
NEUT#: 1.8 10*3/uL (ref 1.5–6.5)
NEUT%: 51.1 % (ref 39.0–75.0)
Platelets: 179 10*3/uL (ref 140–400)
WBC: 3.5 10*3/uL — ABNORMAL LOW (ref 4.0–10.3)

## 2009-06-22 LAB — COMPREHENSIVE METABOLIC PANEL
BUN: 15 mg/dL (ref 6–23)
CO2: 23 mEq/L (ref 19–32)
Calcium: 9 mg/dL (ref 8.4–10.5)
Chloride: 104 mEq/L (ref 96–112)
Creatinine, Ser: 0.89 mg/dL (ref 0.40–1.50)
Glucose, Bld: 154 mg/dL — ABNORMAL HIGH (ref 70–99)

## 2009-06-22 LAB — FERRITIN: Ferritin: 2043 ng/mL — ABNORMAL HIGH (ref 22–322)

## 2009-06-24 LAB — CBC WITH DIFFERENTIAL/PLATELET
Eosinophils Absolute: 0.3 10*3/uL (ref 0.0–0.5)
HCT: 27.3 % — ABNORMAL LOW (ref 38.4–49.9)
HGB: 9.3 g/dL — ABNORMAL LOW (ref 13.0–17.1)
MONO#: 0.1 10*3/uL (ref 0.1–0.9)
RBC: 3.02 10*6/uL — ABNORMAL LOW (ref 4.20–5.82)
RDW: 17 % — ABNORMAL HIGH (ref 11.0–14.6)
nRBC: 0 % (ref 0–0)

## 2009-06-29 LAB — CBC WITH DIFFERENTIAL/PLATELET
BASO%: 8.6 % — ABNORMAL HIGH (ref 0.0–2.0)
Eosinophils Absolute: 0.4 10*3/uL (ref 0.0–0.5)
MCV: 89 fL (ref 79.3–98.0)
MONO%: 3.2 % (ref 0.0–14.0)
NEUT#: 2.3 10*3/uL (ref 1.5–6.5)
RBC: 3.19 10*6/uL — ABNORMAL LOW (ref 4.20–5.82)
RDW: 16.7 % — ABNORMAL HIGH (ref 11.0–14.6)
WBC: 4.7 10*3/uL (ref 4.0–10.3)
nRBC: 0 % (ref 0–0)

## 2009-07-06 ENCOUNTER — Ambulatory Visit (HOSPITAL_COMMUNITY): Admission: RE | Admit: 2009-07-06 | Discharge: 2009-07-06 | Payer: Self-pay | Admitting: Oncology

## 2009-07-06 LAB — HOLD TUBE, BLOOD BANK

## 2009-07-06 LAB — CBC WITH DIFFERENTIAL/PLATELET
BASO%: 9.6 % — ABNORMAL HIGH (ref 0.0–2.0)
Basophils Absolute: 0.2 10*3/uL — ABNORMAL HIGH (ref 0.0–0.1)
EOS%: 9.2 % — ABNORMAL HIGH (ref 0.0–7.0)
HGB: 8.3 g/dL — ABNORMAL LOW (ref 13.0–17.1)
MCH: 30.3 pg (ref 27.2–33.4)
MCHC: 33.6 g/dL (ref 32.0–36.0)
RBC: 2.74 10*6/uL — ABNORMAL LOW (ref 4.20–5.82)
RDW: 16.8 % — ABNORMAL HIGH (ref 11.0–14.6)
lymph#: 0.8 10*3/uL — ABNORMAL LOW (ref 0.9–3.3)
nRBC: 0 % (ref 0–0)

## 2009-07-06 LAB — TYPE & CROSSMATCH - CHCC

## 2009-07-21 ENCOUNTER — Ambulatory Visit: Payer: Self-pay | Admitting: Oncology

## 2009-07-21 LAB — CBC WITH DIFFERENTIAL/PLATELET
BASO%: 5.3 % — ABNORMAL HIGH (ref 0.0–2.0)
EOS%: 6.9 % (ref 0.0–7.0)
MCH: 30 pg (ref 27.2–33.4)
MCHC: 33.7 g/dL (ref 32.0–36.0)
MONO#: 0.2 10*3/uL (ref 0.1–0.9)
RBC: 3.47 10*6/uL — ABNORMAL LOW (ref 4.20–5.82)
RDW: 16.1 % — ABNORMAL HIGH (ref 11.0–14.6)
WBC: 4.5 10*3/uL (ref 4.0–10.3)
lymph#: 1.1 10*3/uL (ref 0.9–3.3)

## 2009-07-21 LAB — COMPREHENSIVE METABOLIC PANEL
ALT: 61 U/L — ABNORMAL HIGH (ref 0–53)
AST: 26 U/L (ref 0–37)
Albumin: 4.3 g/dL (ref 3.5–5.2)
CO2: 22 mEq/L (ref 19–32)
Calcium: 9.3 mg/dL (ref 8.4–10.5)
Chloride: 102 mEq/L (ref 96–112)
Potassium: 4.1 mEq/L (ref 3.5–5.3)
Total Protein: 7.2 g/dL (ref 6.0–8.3)

## 2009-07-21 LAB — HOLD TUBE, BLOOD BANK

## 2009-07-21 LAB — LACTATE DEHYDROGENASE: LDH: 172 U/L (ref 94–250)

## 2009-07-21 LAB — TECHNOLOGIST REVIEW

## 2009-07-21 LAB — IRON AND TIBC: Iron: 200 ug/dL — ABNORMAL HIGH (ref 42–165)

## 2009-08-13 LAB — CBC WITH DIFFERENTIAL/PLATELET
BASO%: 2.2 % — ABNORMAL HIGH (ref 0.0–2.0)
Eosinophils Absolute: 0.2 10*3/uL (ref 0.0–0.5)
MCHC: 34.3 g/dL (ref 32.0–36.0)
MCV: 89.1 fL (ref 79.3–98.0)
MONO#: 0.1 10*3/uL (ref 0.1–0.9)
MONO%: 3.3 % (ref 0.0–14.0)
NEUT#: 1.4 10*3/uL — ABNORMAL LOW (ref 1.5–6.5)
RBC: 3.38 10*6/uL — ABNORMAL LOW (ref 4.20–5.82)
RDW: 14.2 % (ref 11.0–14.6)
WBC: 3 10*3/uL — ABNORMAL LOW (ref 4.0–10.3)

## 2009-08-18 ENCOUNTER — Ambulatory Visit: Payer: Self-pay | Admitting: Oncology

## 2009-08-20 LAB — FERRITIN: Ferritin: 2903 ng/mL — ABNORMAL HIGH (ref 22–322)

## 2009-08-20 LAB — CBC WITH DIFFERENTIAL/PLATELET
BASO%: 6.9 % — ABNORMAL HIGH (ref 0.0–2.0)
Eosinophils Absolute: 0.1 10*3/uL (ref 0.0–0.5)
LYMPH%: 33.7 % (ref 14.0–49.0)
MCHC: 33.5 g/dL (ref 32.0–36.0)
MONO#: 0.1 10*3/uL (ref 0.1–0.9)
NEUT#: 1.6 10*3/uL (ref 1.5–6.5)
Platelets: 212 10*3/uL (ref 140–400)
RBC: 3.13 10*6/uL — ABNORMAL LOW (ref 4.20–5.82)
RDW: 15.3 % — ABNORMAL HIGH (ref 11.0–14.6)
WBC: 3 10*3/uL — ABNORMAL LOW (ref 4.0–10.3)
lymph#: 1 10*3/uL (ref 0.9–3.3)
nRBC: 0 % (ref 0–0)

## 2009-08-20 LAB — COMPREHENSIVE METABOLIC PANEL
ALT: 112 U/L — ABNORMAL HIGH (ref 0–53)
AST: 45 U/L — ABNORMAL HIGH (ref 0–37)
CO2: 24 mEq/L (ref 19–32)
Calcium: 9.3 mg/dL (ref 8.4–10.5)
Chloride: 103 mEq/L (ref 96–112)
Sodium: 134 mEq/L — ABNORMAL LOW (ref 135–145)
Total Bilirubin: 0.4 mg/dL (ref 0.3–1.2)
Total Protein: 7.3 g/dL (ref 6.0–8.3)

## 2009-08-20 LAB — LACTATE DEHYDROGENASE: LDH: 163 U/L (ref 94–250)

## 2009-08-20 LAB — TECHNOLOGIST REVIEW

## 2009-09-16 LAB — LACTATE DEHYDROGENASE: LDH: 172 U/L (ref 94–250)

## 2009-09-16 LAB — CBC WITH DIFFERENTIAL/PLATELET
Basophils Absolute: 0.1 10*3/uL (ref 0.0–0.1)
Eosinophils Absolute: 0.1 10*3/uL (ref 0.0–0.5)
HCT: 28.1 % — ABNORMAL LOW (ref 38.4–49.9)
HGB: 9.3 g/dL — ABNORMAL LOW (ref 13.0–17.1)
MCV: 90.9 fL (ref 79.3–98.0)
MONO%: 3.6 % (ref 0.0–14.0)
NEUT#: 1.8 10*3/uL (ref 1.5–6.5)
RDW: 17.4 % — ABNORMAL HIGH (ref 11.0–14.6)

## 2009-09-16 LAB — COMPREHENSIVE METABOLIC PANEL
ALT: 152 U/L — ABNORMAL HIGH (ref 0–53)
CO2: 24 mEq/L (ref 19–32)
Calcium: 8.8 mg/dL (ref 8.4–10.5)
Chloride: 105 mEq/L (ref 96–112)
Creatinine, Ser: 0.96 mg/dL (ref 0.40–1.50)
Sodium: 136 mEq/L (ref 135–145)
Total Protein: 6.9 g/dL (ref 6.0–8.3)

## 2009-09-16 LAB — FERRITIN: Ferritin: 2970 ng/mL — ABNORMAL HIGH (ref 22–322)

## 2009-09-17 ENCOUNTER — Encounter (HOSPITAL_COMMUNITY): Admission: RE | Admit: 2009-09-17 | Discharge: 2009-12-10 | Payer: Self-pay | Admitting: Oncology

## 2009-09-23 ENCOUNTER — Ambulatory Visit: Payer: Self-pay | Admitting: Oncology

## 2009-09-23 LAB — CBC WITH DIFFERENTIAL/PLATELET
BASO%: 4.6 % — ABNORMAL HIGH (ref 0.0–2.0)
LYMPH%: 30.4 % (ref 14.0–49.0)
MCH: 30.7 pg (ref 27.2–33.4)
MCHC: 33.4 g/dL (ref 32.0–36.0)
MCV: 91.8 fL (ref 79.3–98.0)
MONO%: 4.9 % (ref 0.0–14.0)
Platelets: 212 10*3/uL (ref 140–400)
RBC: 3.16 10*6/uL — ABNORMAL LOW (ref 4.20–5.82)
nRBC: 0 % (ref 0–0)

## 2009-10-13 LAB — CBC WITH DIFFERENTIAL/PLATELET
BASO%: 0.9 % (ref 0.0–2.0)
HCT: 25.4 % — ABNORMAL LOW (ref 38.4–49.9)
LYMPH%: 28.1 % (ref 14.0–49.0)
MCH: 32.9 pg (ref 27.2–33.4)
MCHC: 35.3 g/dL (ref 32.0–36.0)
MONO#: 0.2 10*3/uL (ref 0.1–0.9)
NEUT%: 59.5 % (ref 39.0–75.0)
Platelets: 130 10*3/uL — ABNORMAL LOW (ref 140–400)
WBC: 2.9 10*3/uL — ABNORMAL LOW (ref 4.0–10.3)

## 2009-10-23 ENCOUNTER — Ambulatory Visit: Payer: Self-pay | Admitting: Oncology

## 2009-10-23 LAB — CBC WITH DIFFERENTIAL/PLATELET
Basophils Absolute: 0.1 10*3/uL (ref 0.0–0.1)
Eosinophils Absolute: 0.1 10*3/uL (ref 0.0–0.5)
HCT: 29.1 % — ABNORMAL LOW (ref 38.4–49.9)
HGB: 9.9 g/dL — ABNORMAL LOW (ref 13.0–17.1)
LYMPH%: 27.8 % (ref 14.0–49.0)
MCV: 93.6 fL (ref 79.3–98.0)
MONO%: 3.5 % (ref 0.0–14.0)
NEUT#: 1.5 10*3/uL (ref 1.5–6.5)
NEUT%: 59.1 % (ref 39.0–75.0)
Platelets: 204 10*3/uL (ref 140–400)
RBC: 3.11 10*6/uL — ABNORMAL LOW (ref 4.20–5.82)

## 2009-11-03 LAB — LACTATE DEHYDROGENASE: LDH: 196 U/L (ref 94–250)

## 2009-11-03 LAB — CBC WITH DIFFERENTIAL/PLATELET
Basophils Absolute: 0.2 10*3/uL — ABNORMAL HIGH (ref 0.0–0.1)
Eosinophils Absolute: 0.2 10*3/uL (ref 0.0–0.5)
HCT: 30.2 % — ABNORMAL LOW (ref 38.4–49.9)
HGB: 10.2 g/dL — ABNORMAL LOW (ref 13.0–17.1)
LYMPH%: 31.2 % (ref 14.0–49.0)
MCV: 95.3 fL (ref 79.3–98.0)
MONO%: 3.9 % (ref 0.0–14.0)
NEUT#: 1.5 10*3/uL (ref 1.5–6.5)
NEUT%: 51.6 % (ref 39.0–75.0)
Platelets: 210 10*3/uL (ref 140–400)
RDW: 22.4 % — ABNORMAL HIGH (ref 11.0–14.6)

## 2009-11-03 LAB — COMPREHENSIVE METABOLIC PANEL
ALT: 91 U/L — ABNORMAL HIGH (ref 0–53)
CO2: 21 mEq/L (ref 19–32)
Calcium: 9.5 mg/dL (ref 8.4–10.5)
Chloride: 104 mEq/L (ref 96–112)
Creatinine, Ser: 0.86 mg/dL (ref 0.40–1.50)

## 2009-11-03 LAB — TECHNOLOGIST REVIEW

## 2009-12-11 ENCOUNTER — Ambulatory Visit: Payer: Self-pay | Admitting: Oncology

## 2009-12-11 LAB — CBC WITH DIFFERENTIAL/PLATELET
Basophils Absolute: 0.2 10*3/uL — ABNORMAL HIGH (ref 0.0–0.1)
Eosinophils Absolute: 0.3 10*3/uL (ref 0.0–0.5)
HCT: 27.9 % — ABNORMAL LOW (ref 38.4–49.9)
LYMPH%: 29.9 % (ref 14.0–49.0)
MCV: 101.1 fL — ABNORMAL HIGH (ref 79.3–98.0)
MONO#: 0.2 10*3/uL (ref 0.1–0.9)
NEUT#: 2.1 10*3/uL (ref 1.5–6.5)
NEUT%: 54.4 % (ref 39.0–75.0)
Platelets: 166 10*3/uL (ref 140–400)
WBC: 3.8 10*3/uL — ABNORMAL LOW (ref 4.0–10.3)

## 2010-01-05 LAB — CBC WITH DIFFERENTIAL/PLATELET
BASO%: 3.8 % — ABNORMAL HIGH (ref 0.0–2.0)
EOS%: 7.6 % — ABNORMAL HIGH (ref 0.0–7.0)
Eosinophils Absolute: 0.4 10*3/uL (ref 0.0–0.5)
LYMPH%: 13.6 % — ABNORMAL LOW (ref 14.0–49.0)
MCHC: 34 g/dL (ref 32.0–36.0)
MCV: 95.9 fL (ref 79.3–98.0)
MONO%: 4.9 % (ref 0.0–14.0)
NEUT#: 3.3 10*3/uL (ref 1.5–6.5)
Platelets: 287 10*3/uL (ref 140–400)
RBC: 3.43 10*6/uL — ABNORMAL LOW (ref 4.20–5.82)
RDW: 23.3 % — ABNORMAL HIGH (ref 11.0–14.6)
nRBC: 0 % (ref 0–0)

## 2010-01-06 LAB — COMPREHENSIVE METABOLIC PANEL
ALT: 121 U/L — ABNORMAL HIGH (ref 0–53)
AST: 42 U/L — ABNORMAL HIGH (ref 0–37)
Alkaline Phosphatase: 152 U/L — ABNORMAL HIGH (ref 39–117)
Creatinine, Ser: 1.15 mg/dL (ref 0.40–1.50)
Sodium: 134 mEq/L — ABNORMAL LOW (ref 135–145)
Total Bilirubin: 0.9 mg/dL (ref 0.3–1.2)
Total Protein: 7.6 g/dL (ref 6.0–8.3)

## 2010-01-06 LAB — MAGNESIUM: Magnesium: 2.3 mg/dL (ref 1.5–2.5)

## 2010-01-06 LAB — PHOSPHORUS: Phosphorus: 2.7 mg/dL (ref 2.3–4.6)

## 2010-01-14 ENCOUNTER — Ambulatory Visit: Payer: Self-pay | Admitting: Oncology

## 2010-01-15 LAB — COMPREHENSIVE METABOLIC PANEL
ALT: 61 U/L — ABNORMAL HIGH (ref 0–53)
AST: 27 U/L (ref 0–37)
Albumin: 4 g/dL (ref 3.5–5.2)
BUN: 15 mg/dL (ref 6–23)
CO2: 20 mEq/L (ref 19–32)
Calcium: 8.7 mg/dL (ref 8.4–10.5)
Chloride: 105 mEq/L (ref 96–112)
Potassium: 4.5 mEq/L (ref 3.5–5.3)

## 2010-01-15 LAB — IRON AND TIBC
Iron: 225 ug/dL — ABNORMAL HIGH (ref 42–165)
TIBC: 328 ug/dL (ref 215–435)

## 2010-01-15 LAB — CBC WITH DIFFERENTIAL/PLATELET
BASO%: 1.9 % (ref 0.0–2.0)
Eosinophils Absolute: 0.2 10*3/uL (ref 0.0–0.5)
HCT: 26.6 % — ABNORMAL LOW (ref 38.4–49.9)
HGB: 9.4 g/dL — ABNORMAL LOW (ref 13.0–17.1)
MCHC: 35.3 g/dL (ref 32.0–36.0)
MONO#: 0.1 10*3/uL (ref 0.1–0.9)
NEUT#: 3.2 10*3/uL (ref 1.5–6.5)
NEUT%: 68.9 % (ref 39.0–75.0)
Platelets: 205 10*3/uL (ref 140–400)
WBC: 4.6 10*3/uL (ref 4.0–10.3)
lymph#: 1 10*3/uL (ref 0.9–3.3)

## 2010-01-18 ENCOUNTER — Encounter (HOSPITAL_COMMUNITY): Admission: RE | Admit: 2010-01-18 | Discharge: 2010-04-18 | Payer: Self-pay | Admitting: Oncology

## 2010-02-12 LAB — CBC WITH DIFFERENTIAL/PLATELET
BASO%: 2.2 % — ABNORMAL HIGH (ref 0.0–2.0)
EOS%: 5.1 % (ref 0.0–7.0)
LYMPH%: 28.7 % (ref 14.0–49.0)
MCH: 34.7 pg — ABNORMAL HIGH (ref 27.2–33.4)
MCHC: 34.7 g/dL (ref 32.0–36.0)
MONO#: 0.2 10*3/uL (ref 0.1–0.9)
NEUT%: 60.4 % (ref 39.0–75.0)
RBC: 2.72 10*6/uL — ABNORMAL LOW (ref 4.20–5.82)
WBC: 4.5 10*3/uL (ref 4.0–10.3)
lymph#: 1.3 10*3/uL (ref 0.9–3.3)

## 2010-02-12 LAB — TYPE & CROSSMATCH - CHCC

## 2010-02-12 LAB — HOLD TUBE, BLOOD BANK

## 2010-02-26 ENCOUNTER — Ambulatory Visit: Payer: Self-pay | Admitting: Oncology

## 2010-03-02 LAB — CBC WITH DIFFERENTIAL/PLATELET
BASO%: 6.8 % — ABNORMAL HIGH (ref 0.0–2.0)
EOS%: 4.5 % (ref 0.0–7.0)
MCH: 32.6 pg (ref 27.2–33.4)
MCHC: 33.6 g/dL (ref 32.0–36.0)
MONO#: 0.1 10*3/uL (ref 0.1–0.9)
RBC: 2.7 10*6/uL — ABNORMAL LOW (ref 4.20–5.82)
RDW: 24.5 % — ABNORMAL HIGH (ref 11.0–14.6)
WBC: 3.8 10*3/uL — ABNORMAL LOW (ref 4.0–10.3)
lymph#: 1.2 10*3/uL (ref 0.9–3.3)
nRBC: 0 % (ref 0–0)

## 2010-03-02 LAB — HOLD TUBE, BLOOD BANK

## 2010-04-21 ENCOUNTER — Ambulatory Visit: Payer: Self-pay | Admitting: Oncology

## 2010-04-21 LAB — CBC WITH DIFFERENTIAL/PLATELET
BASO%: 0.2 % (ref 0.0–2.0)
HCT: 27.8 % — ABNORMAL LOW (ref 38.4–49.9)
MCHC: 33.1 g/dL (ref 32.0–36.0)
MONO#: 0.5 10*3/uL (ref 0.1–0.9)
NEUT%: 76.2 % — ABNORMAL HIGH (ref 39.0–75.0)
RBC: 2.99 10*6/uL — ABNORMAL LOW (ref 4.20–5.82)
RDW: 20.6 % — ABNORMAL HIGH (ref 11.0–14.6)
WBC: 4.2 10*3/uL (ref 4.0–10.3)
lymph#: 0.5 10*3/uL — ABNORMAL LOW (ref 0.9–3.3)
nRBC: 0 % (ref 0–0)

## 2010-04-21 LAB — HOLD TUBE, BLOOD BANK

## 2010-05-05 LAB — BASIC METABOLIC PANEL
BUN: 19 mg/dL (ref 6–23)
Calcium: 9.7 mg/dL (ref 8.4–10.5)
Potassium: 4.2 mEq/L (ref 3.5–5.3)

## 2010-05-05 LAB — CBC WITH DIFFERENTIAL/PLATELET
BASO%: 0.3 % (ref 0.0–2.0)
EOS%: 1.1 % (ref 0.0–7.0)
MCH: 33 pg (ref 27.2–33.4)
MCHC: 33.3 g/dL (ref 32.0–36.0)
NEUT%: 70.4 % (ref 39.0–75.0)
RDW: 22.8 % — ABNORMAL HIGH (ref 11.0–14.6)
lymph#: 0.7 10*3/uL — ABNORMAL LOW (ref 0.9–3.3)

## 2010-05-05 LAB — HOLD TUBE, BLOOD BANK

## 2010-07-11 IMAGING — CT CT ANGIO CHEST
2 of 6 series · 18 of 36 positions shown · IV contrast (APPLIED)
Comparison: 05/23/2008

Addendum Begins

These examinations were reviewed at the request of Dr. Esko Olavi on
01/20/2009.  Dr. Tacksoon is on vacation and unavailable to provide
clarification.  Studies were done 11/25/2008 via right arm
injection and on 11/26/2008 via left arm injection.  The 11/26/2008
examination was limited to the upper chest to evaluate the central
venous structures.  The two examinations were dictated together at
the original interpretation.
On review of the studies with attention to the central venous
structures, there is mixing artifact in the superior vena cava on
both examinations.  There is no evidence of central venous thrombus
or stenosis.  No extrinsic compression of the left subclavian or
brachiocephalic veins is identified.  Collateral vessels are noted
within the anterior left chest wall and lower neck.
Findings were discussed by telephone with Dr. Esko Olavi.
Addendum Ends
CLINICAL DATA: Left arm edema and prominent left chest wall
vessels.  Evaluate for left subclavian vein stenosis/clot.
Myelodysplastic syndrome.
CT ANGIOGRAPHY CHEST,CT CHEST WITH CONTRAST
TECHNIQUE: Multidetector CT imaging of the chest using the
standard protocol during bolus administration of intravenous
contrast. Multiplanar reconstructed images including MIPs were
obtained and reviewed to evaluate the vascular anatomy.
Contrast: 80 ml [id number]-[id number]

[Series 6: lt subclav clot thins @ 1mm · axial · 0.77mm/px · z∈[-276,-41]mm · 17 of 263 slices shown]
[im 14/263  lung]
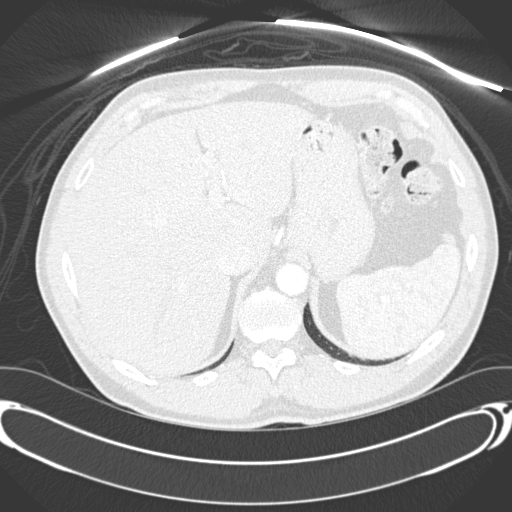
[im 27/263  mediastinal]
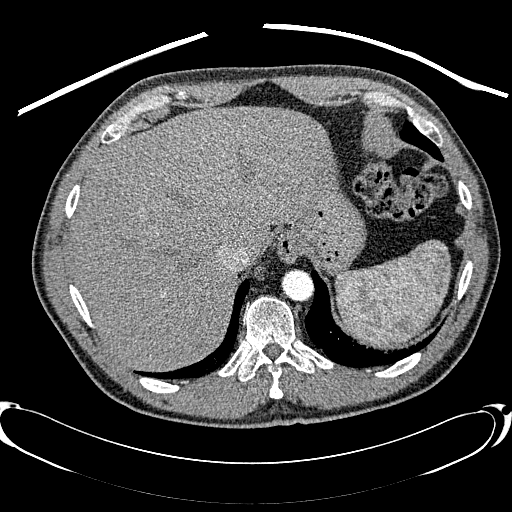
[im 40/263  lung]
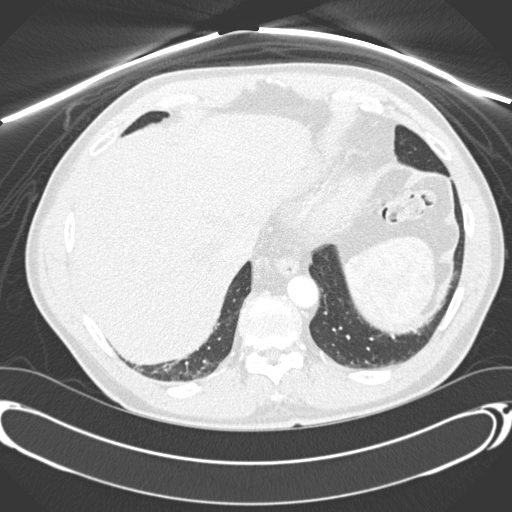
[im 53/263  mediastinal]
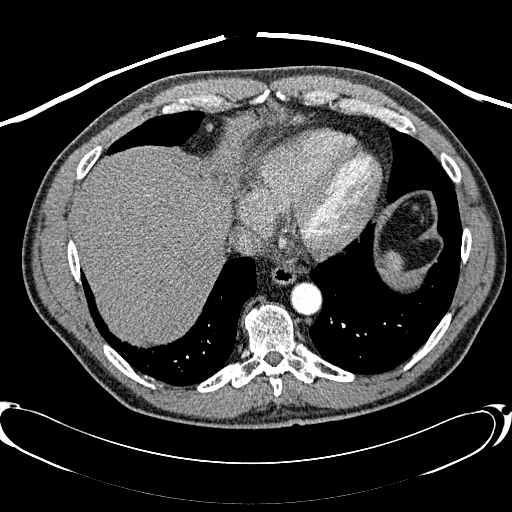
[im 79/263  lung]
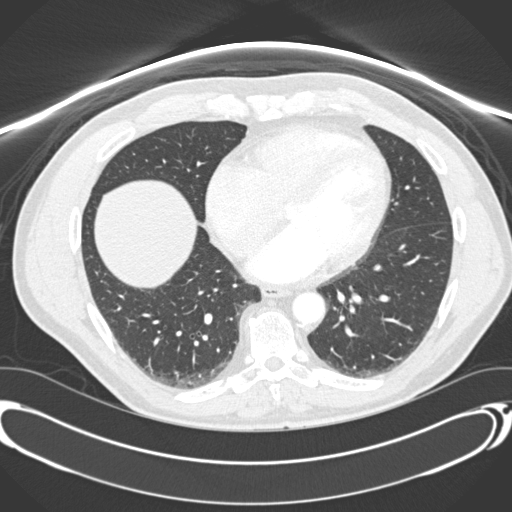
[im 92/263  mediastinal]
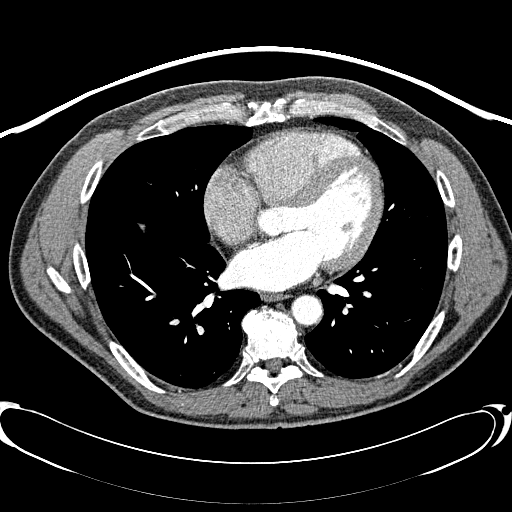
[im 105/263  lung]
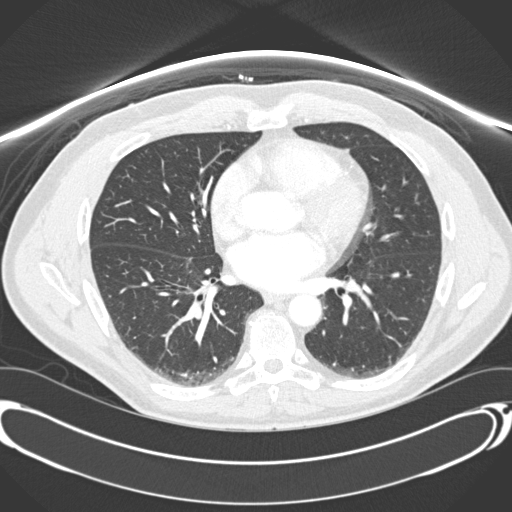
[im 118/263  mediastinal]
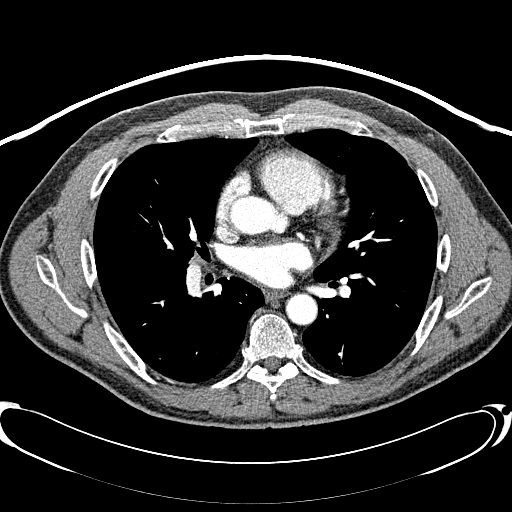
[im 132/263  lung]
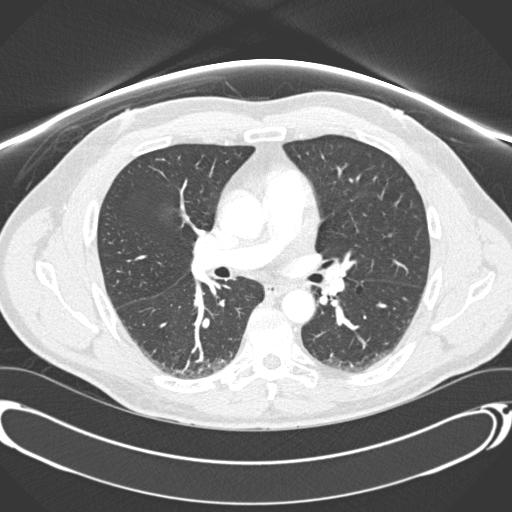
[im 145/263  mediastinal]
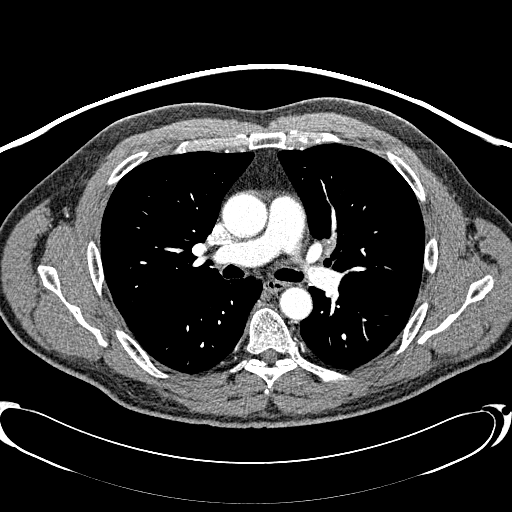
[im 158/263  lung]
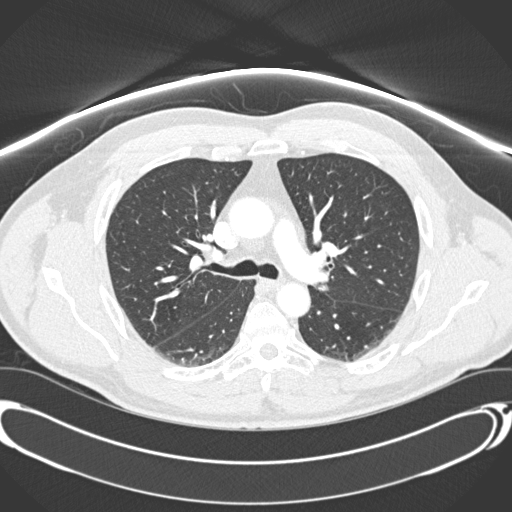
[im 171/263  mediastinal]
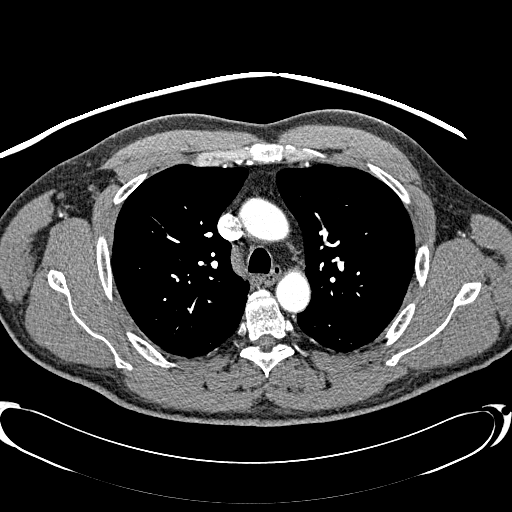
[im 184/263  lung]
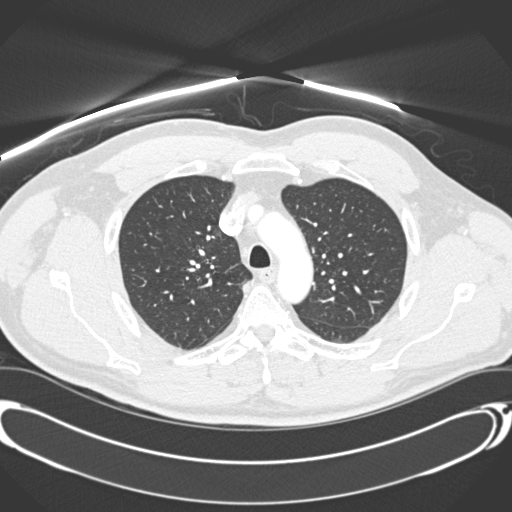
[im 210/263  mediastinal]
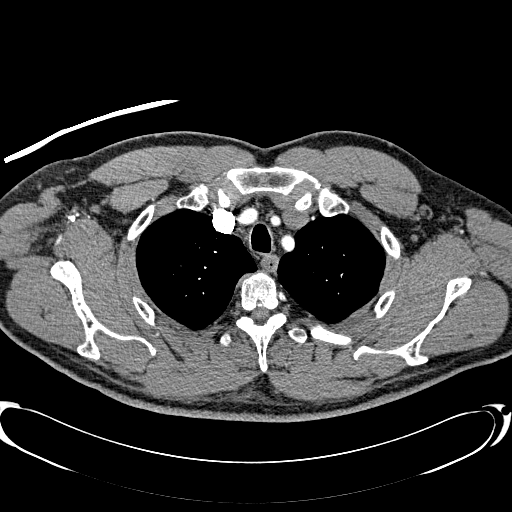
[im 223/263  lung]
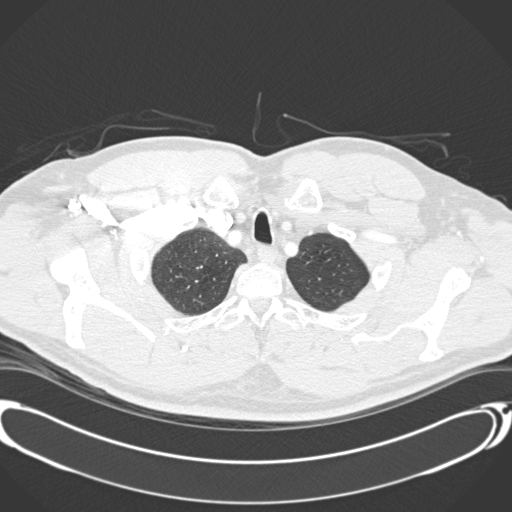
[im 236/263  mediastinal]
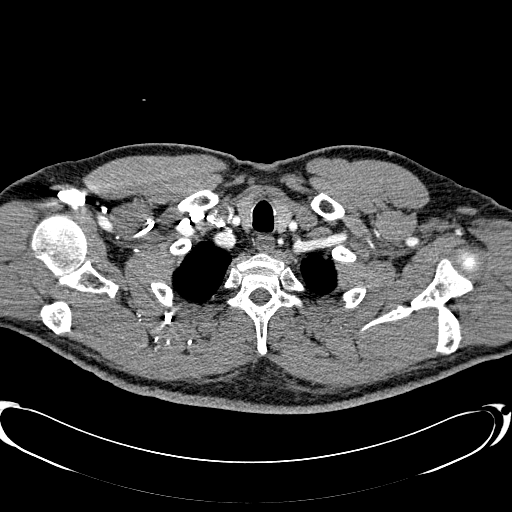
[im 249/263  lung]
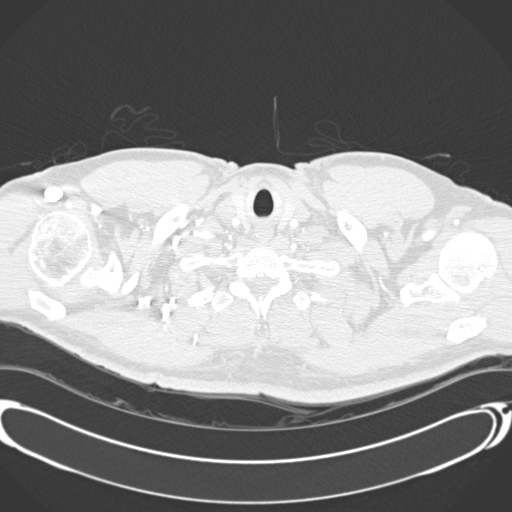

[Series 602: coronal mpr · coronal · 0.77mm/px · 1 of 126 slices shown]
[im 63/126  mediastinal]
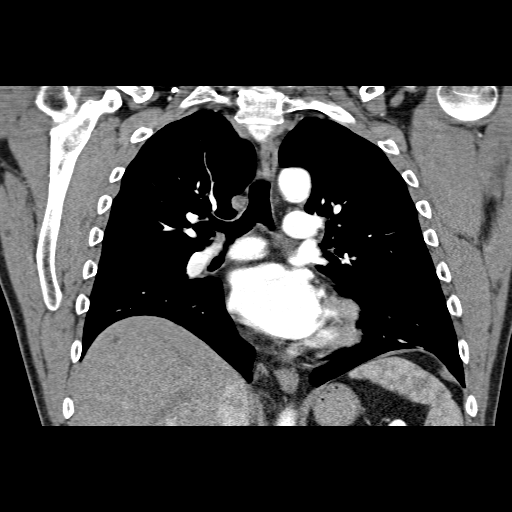

[18 of 36 positions shown; findings below may reference images not displayed]

FINDINGS: Left subclavian vein is patent.  There is a filling
defect at the junction of the left internal jugular and left
subclavian veins, which is likely due to venous inflow and mixing.
Some reflux of contrast is seen into the left internal jugular
vein.

No pulmonary embolus.  There are calcified mediastinal and left
hilar lymph nodes.  No pathologically enlarged mediastinal, hilar
or axillary lymph nodes.  Heart size within normal limits.  No
pericardial effusion.  There are small retrocrural lymph nodes.

Lungs show mild dependent atelectasis bilaterally.  Lungs otherwise
clear.  No pleural fluid.  Airway is unremarkable.

Incidental imaging of the upper abdomen shows a tiny low
attenuation lesion in the dome of the liver, which is likely a
small cyst or hemangioma.  No worrisome lytic or sclerotic lesions.
IMPRESSION: 1.  Left subclavian vein appears patent.  A small filling defect in
the left subclavian vein is likely due to inflow of unopacified
blood from the left internal jugular vein, and resultant mixing
artifact.  Clot cannot be definitively excluded.  Ultrasound could
be performed in further evaluation for thrombus, as clinically
indicated.
2.  The patient was brought back to the [HOSPITAL] for
dedicated imaging of the left subclavian vein on 11/26/2008
(accession [id number]), and should not be charged for that exam.

## 2010-08-06 ENCOUNTER — Ambulatory Visit: Payer: Self-pay | Admitting: Oncology

## 2010-08-06 LAB — CBC WITH DIFFERENTIAL/PLATELET
Basophils Absolute: 0 10*3/uL (ref 0.0–0.1)
EOS%: 0 % (ref 0.0–7.0)
Eosinophils Absolute: 0 10*3/uL (ref 0.0–0.5)
HCT: 35.9 % — ABNORMAL LOW (ref 38.4–49.9)
HGB: 12.6 g/dL — ABNORMAL LOW (ref 13.0–17.1)
MCH: 36.5 pg — ABNORMAL HIGH (ref 27.2–33.4)
MCV: 104.1 fL — ABNORMAL HIGH (ref 79.3–98.0)
MONO%: 3.7 % (ref 0.0–14.0)
NEUT%: 82.6 % — ABNORMAL HIGH (ref 39.0–75.0)
Platelets: 117 10*3/uL — ABNORMAL LOW (ref 140–400)

## 2010-08-06 LAB — COMPREHENSIVE METABOLIC PANEL
Albumin: 4.1 g/dL (ref 3.5–5.2)
Alkaline Phosphatase: 103 U/L (ref 39–117)
BUN: 31 mg/dL — ABNORMAL HIGH (ref 6–23)
CO2: 20 mEq/L (ref 19–32)
Glucose, Bld: 448 mg/dL — ABNORMAL HIGH (ref 70–99)
Total Bilirubin: 1.2 mg/dL (ref 0.3–1.2)

## 2010-08-06 LAB — MAGNESIUM: Magnesium: 2 mg/dL (ref 1.5–2.5)

## 2010-09-14 ENCOUNTER — Ambulatory Visit: Payer: Self-pay | Admitting: Oncology

## 2010-09-14 LAB — CBC WITH DIFFERENTIAL/PLATELET
BASO%: 0 % (ref 0.0–2.0)
Basophils Absolute: 0 10*3/uL (ref 0.0–0.1)
EOS%: 0.4 % (ref 0.0–7.0)
Eosinophils Absolute: 0 10*3/uL (ref 0.0–0.5)
HCT: 39.2 % (ref 38.4–49.9)
HGB: 13.8 g/dL (ref 13.0–17.1)
LYMPH%: 9.4 % — ABNORMAL LOW (ref 14.0–49.0)
MCH: 37.5 pg — ABNORMAL HIGH (ref 27.2–33.4)
MCHC: 35.2 g/dL (ref 32.0–36.0)
MCV: 106.5 fL — ABNORMAL HIGH (ref 79.3–98.0)
MONO#: 1 10*3/uL — ABNORMAL HIGH (ref 0.1–0.9)
MONO%: 11.7 % (ref 0.0–14.0)
NEUT#: 6.6 10*3/uL — ABNORMAL HIGH (ref 1.5–6.5)
NEUT%: 78.5 % — ABNORMAL HIGH (ref 39.0–75.0)
Platelets: 207 10*3/uL (ref 140–400)
RBC: 3.68 10*6/uL — ABNORMAL LOW (ref 4.20–5.82)
RDW: 15.5 % — ABNORMAL HIGH (ref 11.0–14.6)
WBC: 8.4 10*3/uL (ref 4.0–10.3)
lymph#: 0.8 10*3/uL — ABNORMAL LOW (ref 0.9–3.3)
nRBC: 0 % (ref 0–0)

## 2010-09-14 LAB — COMPREHENSIVE METABOLIC PANEL
ALT: 31 U/L (ref 0–53)
AST: 19 U/L (ref 0–37)
Albumin: 4 g/dL (ref 3.5–5.2)
Alkaline Phosphatase: 76 U/L (ref 39–117)
BUN: 24 mg/dL — ABNORMAL HIGH (ref 6–23)
CO2: 25 mEq/L (ref 19–32)
Calcium: 10 mg/dL (ref 8.4–10.5)
Chloride: 100 mEq/L (ref 96–112)
Creatinine, Ser: 1.67 mg/dL — ABNORMAL HIGH (ref 0.40–1.50)
Glucose, Bld: 179 mg/dL — ABNORMAL HIGH (ref 70–99)
Potassium: 4.1 mEq/L (ref 3.5–5.3)
Sodium: 135 mEq/L (ref 135–145)
Total Bilirubin: 0.8 mg/dL (ref 0.3–1.2)
Total Protein: 7.1 g/dL (ref 6.0–8.3)

## 2010-09-14 LAB — MAGNESIUM: Magnesium: 2.1 mg/dL (ref 1.5–2.5)

## 2010-09-14 LAB — LACTATE DEHYDROGENASE: LDH: 159 U/L (ref 94–250)

## 2010-09-18 ENCOUNTER — Encounter: Payer: Self-pay | Admitting: Orthopedic Surgery

## 2010-09-19 ENCOUNTER — Encounter (HOSPITAL_COMMUNITY): Payer: Self-pay | Admitting: Oncology

## 2010-11-14 LAB — CROSSMATCH
ABO/RH(D): O NEG
ABO/RH(D): O NEG
ABO/RH(D): O NEG
ABO/RH(D): O NEG
Antibody Screen: NEGATIVE
Antibody Screen: NEGATIVE
Antibody Screen: NEGATIVE
Antibody Screen: NEGATIVE

## 2010-11-15 LAB — CROSSMATCH

## 2010-11-17 LAB — CROSSMATCH
ABO/RH(D): O NEG
Antibody Screen: NEGATIVE

## 2010-11-29 LAB — CROSSMATCH: Antibody Screen: NEGATIVE

## 2010-12-01 LAB — CROSSMATCH: Antibody Screen: NEGATIVE

## 2010-12-02 LAB — CROSSMATCH
ABO/RH(D): O NEG
ABO/RH(D): O NEG
Antibody Screen: NEGATIVE

## 2010-12-02 LAB — SAMPLE TO BLOOD BANK

## 2010-12-03 LAB — CROSSMATCH
ABO/RH(D): O NEG
Antibody Screen: NEGATIVE

## 2010-12-04 LAB — CROSSMATCH
ABO/RH(D): O NEG
Antibody Screen: NEGATIVE

## 2010-12-06 LAB — CROSSMATCH
ABO/RH(D): O NEG
Antibody Screen: NEGATIVE

## 2010-12-07 LAB — CROSSMATCH: ABO/RH(D): O NEG

## 2010-12-08 LAB — CROSSMATCH
ABO/RH(D): O NEG
Antibody Screen: NEGATIVE

## 2011-01-11 NOTE — Discharge Summary (Signed)
NAME:  [name], [name]:  [id number]   MEDICAL RECORD NO.:  [id number]          PATIENT TYPE:  INP   LOCATION:  1521                         FACILITY:  [facility]   PHYSICIAN:  Samul Dada, M.D.DATE OF BIRTH:  11-18-1950   DATE OF ADMISSION:  05/23/2008  DATE OF DISCHARGE:  05/27/2008                               DISCHARGE SUMMARY   DISCHARGE DIAGNOSES:  1. Left upper lobe pneumonia with dense consolidation or organism      undetermined.  2. Myelodysplastic syndrome with 5q- syndrome.   PROCEDURES:  1. CT scan of the chest.  2. CT chest angiogram on May 23, 2008.   HISTORY:  Randall Rollins is a 60 year old white married male who was  diagnosed initially in early July of this year, i.e. 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  Randall Rollins  was seen in consultation by Dr. Willette Pa at [facility] and placed on  Revlimid 10 mg daily which he had been on for about a month.  He seemed  to be having a good response with a rise in his hemoglobin to about 10.  Prior to starting Revlimid, he had received a couple units of red cells  at [facility].  His admission to the hospital began with an illness which  started approximately a week ago characterized by shortness of breath,  left chest pain, fever, chills, severe anorexia and lack of energy.  Randall Rollins  was seen in the office as a work-in appointment on Wednesday, May 21, 2008.  Chest x-ray at that time showed a dense consolidation in the  left upper lobe.  Randall Rollins was started on Avelox as an outpatient.  He was  seen again for followup on May 23, 2008.  His condition was  unchanged.  Chest x-ray on May 23, 2008, however, showed an  increase in the infiltrate.  This was fairly impressive with an  appearance that even suggested the possibility of tumor or fungus.  For  that reason, it was decided to admit Randall Rollins from home to the hospital for  further evaluation and  intravenous antibiotics.   Randall Rollins had a CT angiogram of the chest on May 23, 2008 that was  negative for pulmonary embolism.  There was a dense infiltrate in the  left upper lobe compatible with pneumonia.  There was a small airspace  density in the right upper lobe measuring 1 cm, possibly pneumonia.  There was no pulmonary embolism or adenopathy.  There was a mild pleural  nodularity seen posteriorly on the left, possibly a pleural reaction.  Blood cultures were obtained which ultimately were negative.  Sputum  cultures were ordered, but apparently for whatever reason were not  submitted.  We have therefore no result from the sputum, although for  the most part, Randall Rollins's cough was for the most part unproductive.  He was  treated with intravenous vancomycin, Rocephin, initially IV Zithromax  and subsequently p.o. Zithromax.  Treatment continued for the duration  of his admission from May 23, 2008 through today which is  May 27, 2008.   I  should mention that on his physical exam at the time of admission, Randall Rollins  did not appear sick or toxic.  His temperature during his  hospitalization never got much higher than 100.5.  The only suggestion  of an abnormality on his physical exam was some dullness of the left  anterior chest.  Lungs were actually clear with no rales, wheezing or  egophony.  Rest of his physical exam was really unremarkable.   LABORATORY DATA ON ADMISSION:  His white count was 9.6, ANC 27.6, white  count 7.0 with an ANC of 2.1.  His platelet count was adequate with  clumps.  Chemistries were remarkable only for an albumin of 3.2.   DIAGNOSTICS:  Subsequent chest x-rays during his admission, specifically  on May 25, 2008 showed slight decrease in the density in the  anterior aspect of the left upper lobe.  A chest x-ray today on  May 27, 2008 has shown marked improvement.  I have reviewed that  chest x-ray.   DISCHARGE/PLAN:  Randall Rollins will be discharged  from the hospital today.  I am  giving him prescriptions for Cefzil 500 mg to take twice a day for the  next 5 days.  Also prescription for Restoril 30 mg to take at bedtime  and Ativan 1 mg to take at bedtime.  Randall Rollins has had a problem with insomnia  in the past and these medicines seemed to work fairly well for him.  He  will continue on Revlimid 10 mg daily.  He is also on multivitamins,  Xanax 0.25 for anxiety.  Prilosec 40 mg daily.  He also has Ambien CR  12.5 mg as needed for sleep.  There are no restrictions on activity or  diet.  Randall Rollins is being discharged in a markedly improved condition.  He has  been afebrile for the last 24-48 hours.  Symptoms of shortness of  breath, cough, etc. are better.  His O2 saturation on room air during  his hospitalization was 97-98% and continues to be quite good.   Randall Rollins was advised to see Dr. Duaine Dredge regarding Pneumovax if he has not  had this recently.  He has no recent memory of this.  He also needs to  have influenza shot and probably a swine flu shot when it becomes  available in the next couple weeks.   Randall Rollins will have a CBC through our office on a weekly basis.  He will be  coming in I think this Thursday which will be May 29, 2008.  He may  need blood transfusions if his hemoglobin is less than 8.  We will plan  to check CBC on weekly basis.  At some point in the next couple weeks  assuming that Randall Rollins is doing well, he will need to have another chest x-  ray PA and lateral to check on the complete resolution of his pneumonia.  As stated, Randall Rollins will continue on his Revlimid.  Again, he is being  discharged in a markedly improved condition.      Samul Dada, M.D.  Electronically Signed     DSM/MEDQ  D:  05/27/2008  T:  05/27/2008  Job:  [id number]   cc:   Samul Dada, M.D.  Fax: [phone]   Mosetta Putt, M.D.  Fax: [phone]   Willette Pa, MD

## 2011-01-11 NOTE — H&P (Signed)
NAME:  Randall, Rollins NO.:  [id number]   MEDICAL RECORD NO.:  [id number]          PATIENT TYPE:  INP   LOCATION:  1521                         FACILITY:  [facility]   PHYSICIAN:  Samul Dada, M.D.DATE OF BIRTH:  [birth year]   DATE OF ADMISSION:  05/23/2008  DATE OF DISCHARGE:                              HISTORY & PHYSICAL   PATIENT IDENTIFICATION/HISTORY OF PRESENT ILLNESS:  [name]  is a 60 year old white male with history of progressive anemia with  worsening symptomatology for approximately 1 year's duration.  He  underwent bone marrow biopsy and aspirate on February 28, 2008, which  revealed a hypercellular marrow with dyspoietic features.  Peripheral  blood revealed macrocytic anemia.  There were many hypolobated  megacaryocytes raising the possibility of a 5q syndrome.  The 5q  syndrome abnormality has been associated with clinical diagnosis of MDS.  The patient was evaluated by Dr. Willette Pa in August of 2009, at [facility] and for his MDS was initiated  on Revlimid 10 mg p.o. daily.  He also has received transfusion support  for his anemia.  The patient was last evaluated at [facility] on May 16, 2008, and the plan was for him to be managed  with q.2 week lab consisting of CBC with diff and transfusion support if  needed.  The patient was to be scheduled for his next followup  appointment in 6 weeks time; however, he presented for a work-in  appointment on May 21, 2008, for evaluation of generalized  malaise, dyspnea with exertion, rhinorrhea, and productive cough of  clear phlegm.  He was not having any hemoptysis or leg pain.  The  patient had a baseline O2 saturation of 98% on room air with ambulation.  His O2 SAT dropped to 95 to 96%.  The patient was sent for a chest x-  ray, PA and lateral, on September 23rd, which revealed a mass-like  density in the left upper lobe; tumor  cannot be excluded.  It is  possible that the finding represents a round pneumonia.  The patient was  subsequently initiated on Avelox 400 mg p.o. daily for a planned 10-day  course and was scheduled for reassessment on May 23, 2008.  At  that followup visit on September 25th, the patient reported intermittent  fevers of 101 maximum, no associated chills.  The fevers did improve  with Tylenol dropping down to low-grade approximately 99.4.  The patient  also reported occasional continued productive cough of clear phlegm as  well as some chest soreness in the anterior chest that worsened with  cough.  He also continued to have some dyspnea, but stated that this had  not worsened over the past couple of days, and he had been compliant  with his Avelox.  The patient was afebrile in clinic, and 02 saturation  on room air was 98%.  A D-dimer was obtained, which returned 1.88.  Repeat chest x-ray, a PA and lateral, on May 23, 2008, revealed  expanding left upper lobar pneumonia; cannot exclude underlying mass.  The patient was  contacted at home with x-ray results and will  subsequently be admitted for further workup and management.   PAST MEDICAL HISTORY:  1. Hiatal hernia.  2. Gastroesophageal reflux disease.  3. History of recurrent nausea and vomiting.  4. 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  5. History of postphlebitic syndrome and venous insufficiency.  6. History of stress disorder with insomnia.  7. History of shingles involving the chest area at age 86.  8. 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  This occurred during his teenage years.  9. History of abdominal hernia repair in 2000.  10.History of recurrent rash on the left buttock (treated as a      recurrent herpes).  11.Status post surgery on his right leg for varicose veins in 2002.  12.History of ruptured left Achilles  tendon in 2003 status post      surgery.  13.History of plantar fasciitis of the right foot.  14.Status post vasectomy in 1994.   ALLERGIES:  1. PENICILLIN.  2. SULFA DRUGS.  3. PHENOBARBITAL.   MEDICATIONS:  1. Revlimid 10 mg p.o. daily.  2. Multivitamin 1 p.o. daily.  3. Xanax p.r.n. anxiety.  4. Prilosec 40 mg p.o. daily.  5. Ambien-CR 1 p.o. q.h.s. p.r.n. sleep.   FAMILY HISTORY:  The patient's mother died at age 58 from complications  from Zollinger-Ellison syndrome.  Father is alive and well in his mid  19s.  He has had history of coronary artery bypass graft surgery and  mitral valve replacement.  The patient has a brother and 2 sisters, who  are alive and well.  He has a daughter, age 67, and a son, age 18, alive  and well, and no family history of blood dyscrasias.   SOCIAL HISTORY:  The patient is married and resides in [place], [place].  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  He has no history of tobacco use.  He drinks alcohol socially.   REVIEW OF SYSTEMS:  Positive for generalized fatigue and intermittent  fever, max of 101, with some improvement with Tylenol.  No associated  chills.  Positive for dyspnea with exertion and intermittent productive  cough of clear phlegm as well as anterior chest soreness.  No headaches,  dizziness, nausea, vomiting, constipation, diarrhea, bleeding,  hematuria, or swelling of extremities.   PHYSICAL EXAMINATION:  VITAL SIGNS:  Temperature 97.4, heart rate 96,  respirations 18, blood pressure 129/67, weight 200.1 pounds, O2  saturation 98% on room air.  GENERAL:  This is a well-developed, well-nourished gentleman in no acute  distress.  HEENT:  Normocephalic, sclerae nonicteric.  There was no oral thrush or  mucositis.  SKIN:  No rashes or lesions.  LYMPH:  No cervical, supraclavicular, axillary, or inguinal  lymphadenopathy.  CARDIAC:  Regular rate and  rhythm without murmurs or gallops.  Peripheral pulses are 2+ bilaterally.  CHEST:  Lungs essentially clear bilaterally.  ABDOMEN:  Positive bowel sounds, soft, nontender, and nondistended.  No  organomegaly.  EXTREMITIES:  No edema or cyanosis.  NEURO:  Alert and oriented x3.  Strength, sensation, and coordination  all grossly intact.   LABORATORY DATA:  D-dimer from May 23, 2008, 1.88.  CBC with diff  from May 21, 2008:  White blood cell count 9.7, hemoglobin 11.3,  hematocrit 31.7, platelets of 450, ANC  of 7.2, and MCV of 104.  Chemistry showed a low sodium of 134, potassium 4.1, chloride 98, BUN of  12, creatinine 0.97, glucose 131, calcium was 9.5.   IMPRESSION/PLAN:  1. [name] is a 60 year old white male with      myelodysplastic syndrome with 5q deletion.  He has been on Revlimid      10 mg p.o. daily since August of 2009, with transfusion support of      packed red blood cells for his history of progressive anemia.  We      will plan to hold Revlimid during this hospitalization.  2. History of abnormal chest x-ray with pneumonia with worsening x-ray      over a 48-hour period despite Avelox antibiotic therapy.  We will      obtain a computerized tomography (CT) of the chest for further      workup.  Also for the computerized tomography of the chest, we will      plan to obtain this with angio-computerized tomography protocol.      We will also plan to obtain blood cultures and will repeat CBC with      diff, CMET, and LDH today.  The patient will be initiated on      vancomycin, Zosyn, and azithromycin.   COMMENT:  Plan of care is formulated in consultation with Dr. Kimberlee Nearing, who will evaluate patient postadmission.      Sherilyn Banker, NP      Samul Dada, M.D.  Electronically Signed    RJ/MEDQ  D:  05/23/2008  T:  05/23/2008  Job:  [id number]   cc:   Mosetta Putt, M.D.  Fax: [phone]   Griffith Citron, M.D.  Fax:  [phone]

## 2011-01-14 NOTE — Op Note (Signed)
NAME:  Randall Rollins, Randall Rollins                       ACCOUNT NO.:  [id number]   MEDICAL RECORD NO.:  [id number]                   PATIENT TYPE:  AMB   LOCATION:  DSC                                  FACILITY:  [facility]   PHYSICIAN:  Skyelar A. Thurston Hole, M.D.              DATE OF BIRTH:  January 29, 1951   DATE OF PROCEDURE:  06/10/2002  DATE OF DISCHARGE:                                 OPERATIVE REPORT   PREOPERATIVE DIAGNOSES:  Left Achilles tendon rupture.   POSTOPERATIVE DIAGNOSES:  Left Achilles tendon rupture.   PROCEDURE:  Left Achilles tendon repair.   SURGEON:  Elana Alm. Thurston Hole, M.D.   ASSISTANT:  Ivan Anchors   ANESTHESIA:  Popliteal block.   OPERATIVE TIME:  40 minutes.   COMPLICATIONS:  None.   INDICATIONS FOR PROCEDURE:  The patient is a 60 year old gentleman who  sustained a left Achilles tendon rupture 4 days ago with a complete rupture  by exam and ultrasound.  He is now to undergo primary repair.   DESCRIPTION:  The patient was brought to the operating room on June 10, 2002, after a popliteal block placed by Anesthesia in the holding room.  He  was placed on the operating table in a prone position.  His left foot and  leg was prepped using sterile Duraprep and draped using sterile technique.  He received Vancomycin 1 g IV preoperatively for prophylaxis.  Leg was  exsanguinated, and a calf tourniquet elevated 300 mm.  Initially, through a  7-8 cm posterior incision based over the Achilles tendon, initial exposure  was made, and the lines of cutaneous tissues were incised with a long skin  incision.  The peritenon of the Achilles tendon was incised longitudinally,  revealing the underlying completely ruptured Achilles tendon, with  significant disruption in the mid-portion and somewhat proximally as well.  The area was thoroughly debrided of the unhealthy tissue.  Two separate #2  fiber wire locking sutures were then placed, one in the proximal and one in  the distal  aspect of the tendon, and then with the ankle plantar-flexed to  approximately 25 to 30 degrees, these sutures were tied down, thus  reapproximating the tendon back down to its satisfactory tension and  position.  Serial nerve and posterior tibial vessels also carefully  protected while this was done.  After this was done, then further  reinforcing suture 0 Panacryl was used as well.  After this was done, then  the peritenon over the repair was closed with a running 2-0 Vicryl stitch.  Subcutaneous tissues were closed with 2-0 Vicryl.  Skin was closed with skin  staples.  Sterile dressings were applied.  The wound had been injected with  0.25% Marcaine.  Short-leg splint and plantar flexion was then applied.  Tourniquet was released, and the patient turned supine and taken to the  recovery room in stable condition.  Needle and sponge counts correct x2  within the case.    FOLLOWUP CARE:  The patient will be followed as an outpatient on Percocet  for pain.  I will see him back in the office in a week for sutures-out  followup.                                               Valton A. Thurston Hole, M.D.    RAW/MEDQ  D:  06/10/2002  T:  06/11/2002  Job:  [id number]

## 2011-02-15 ENCOUNTER — Encounter (HOSPITAL_BASED_OUTPATIENT_CLINIC_OR_DEPARTMENT_OTHER): Payer: BC Managed Care – PPO | Admitting: Oncology

## 2011-02-15 DIAGNOSIS — R112 Nausea with vomiting, unspecified: Secondary | ICD-10-CM

## 2011-02-15 DIAGNOSIS — D46C Myelodysplastic syndrome with isolated del(5q) chromosomal abnormality: Secondary | ICD-10-CM

## 2011-02-15 DIAGNOSIS — R710 Precipitous drop in hematocrit: Secondary | ICD-10-CM

## 2011-02-15 DIAGNOSIS — E860 Dehydration: Secondary | ICD-10-CM

## 2011-02-19 IMAGING — CR DG CHEST 2V
2 series · 2 of 2 positions shown · non-contrast
Comparison: 09/19/2008

CLINICAL DATA: Shortness of breath.  Chest congestion.

CHEST - 2 VIEW

[w chest pa]
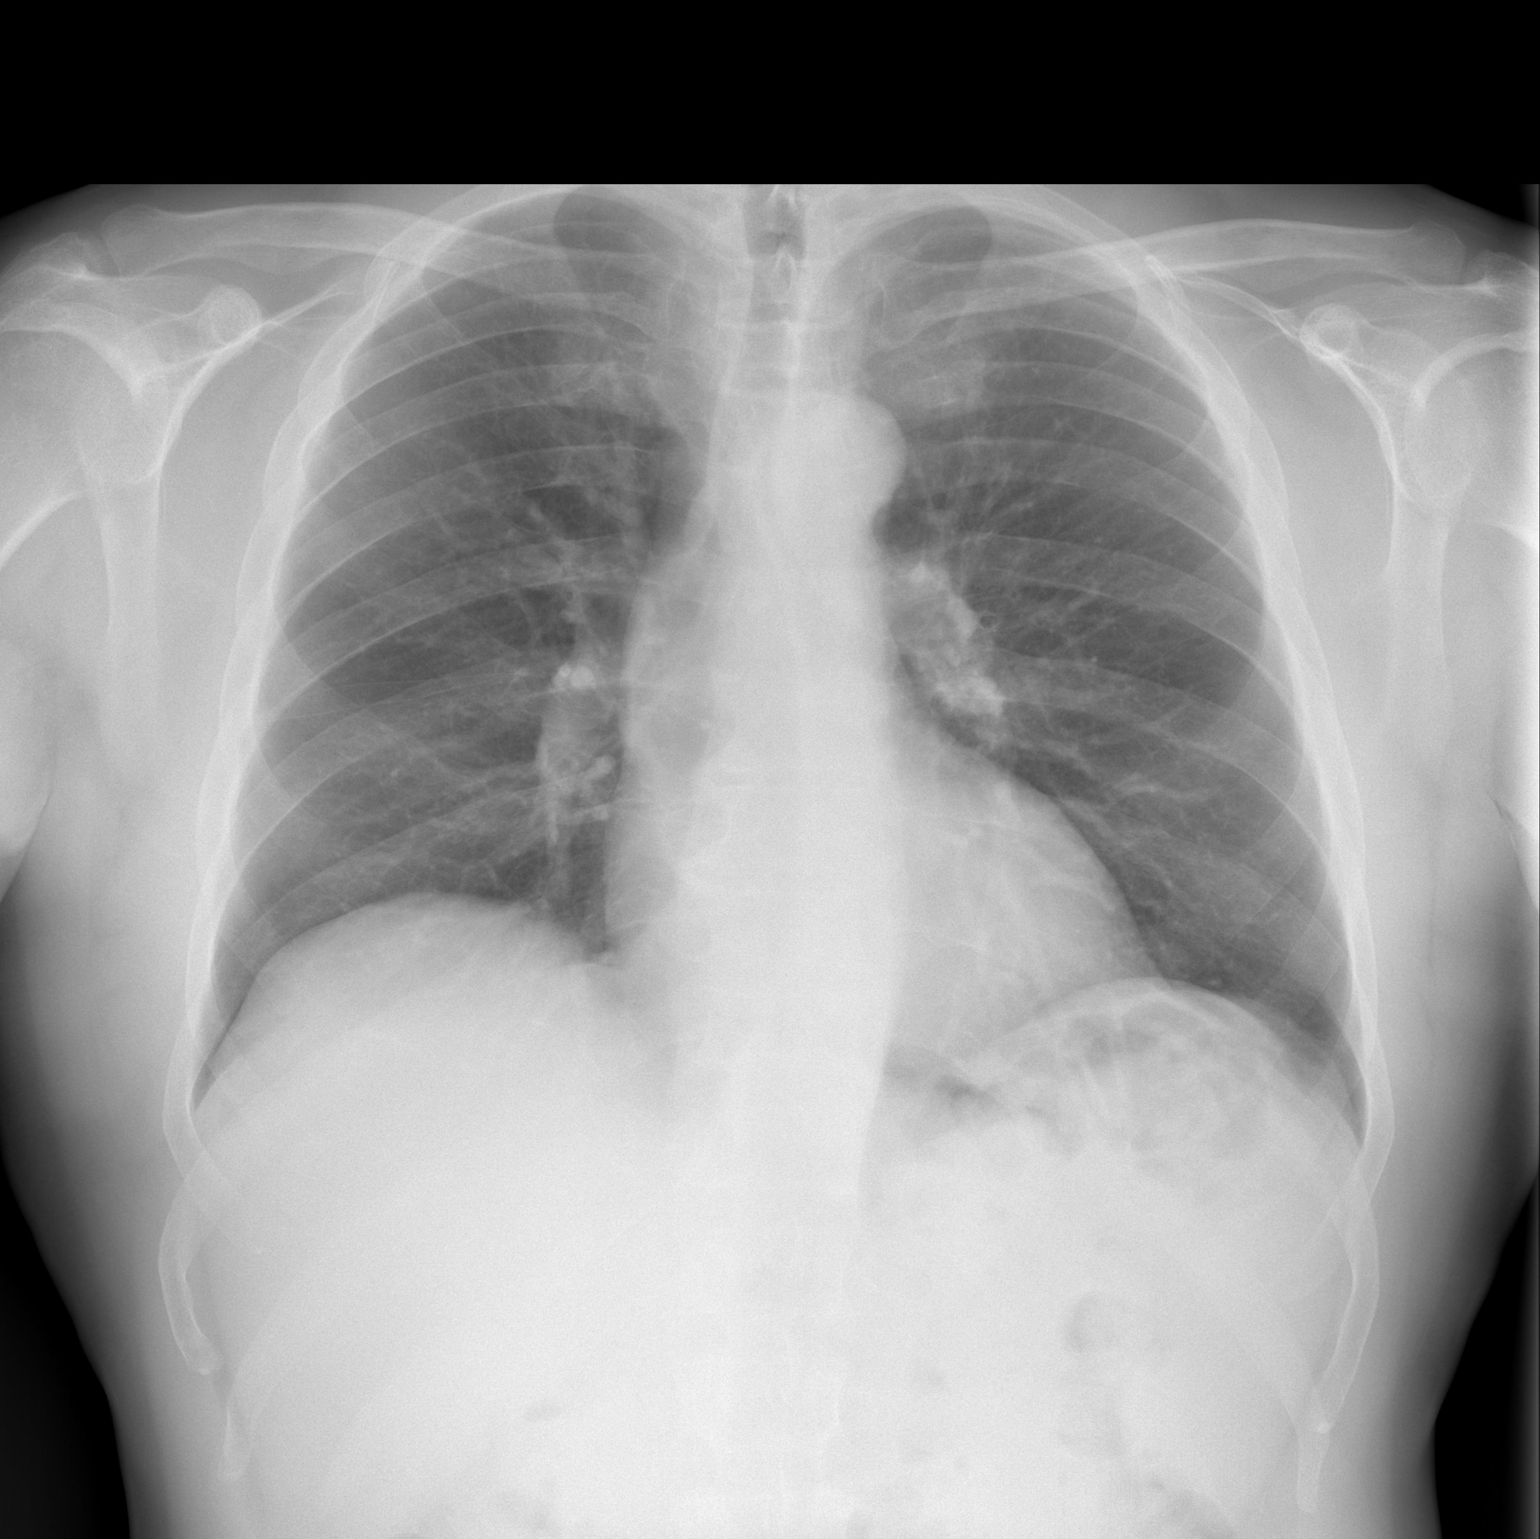

[w chest lat]
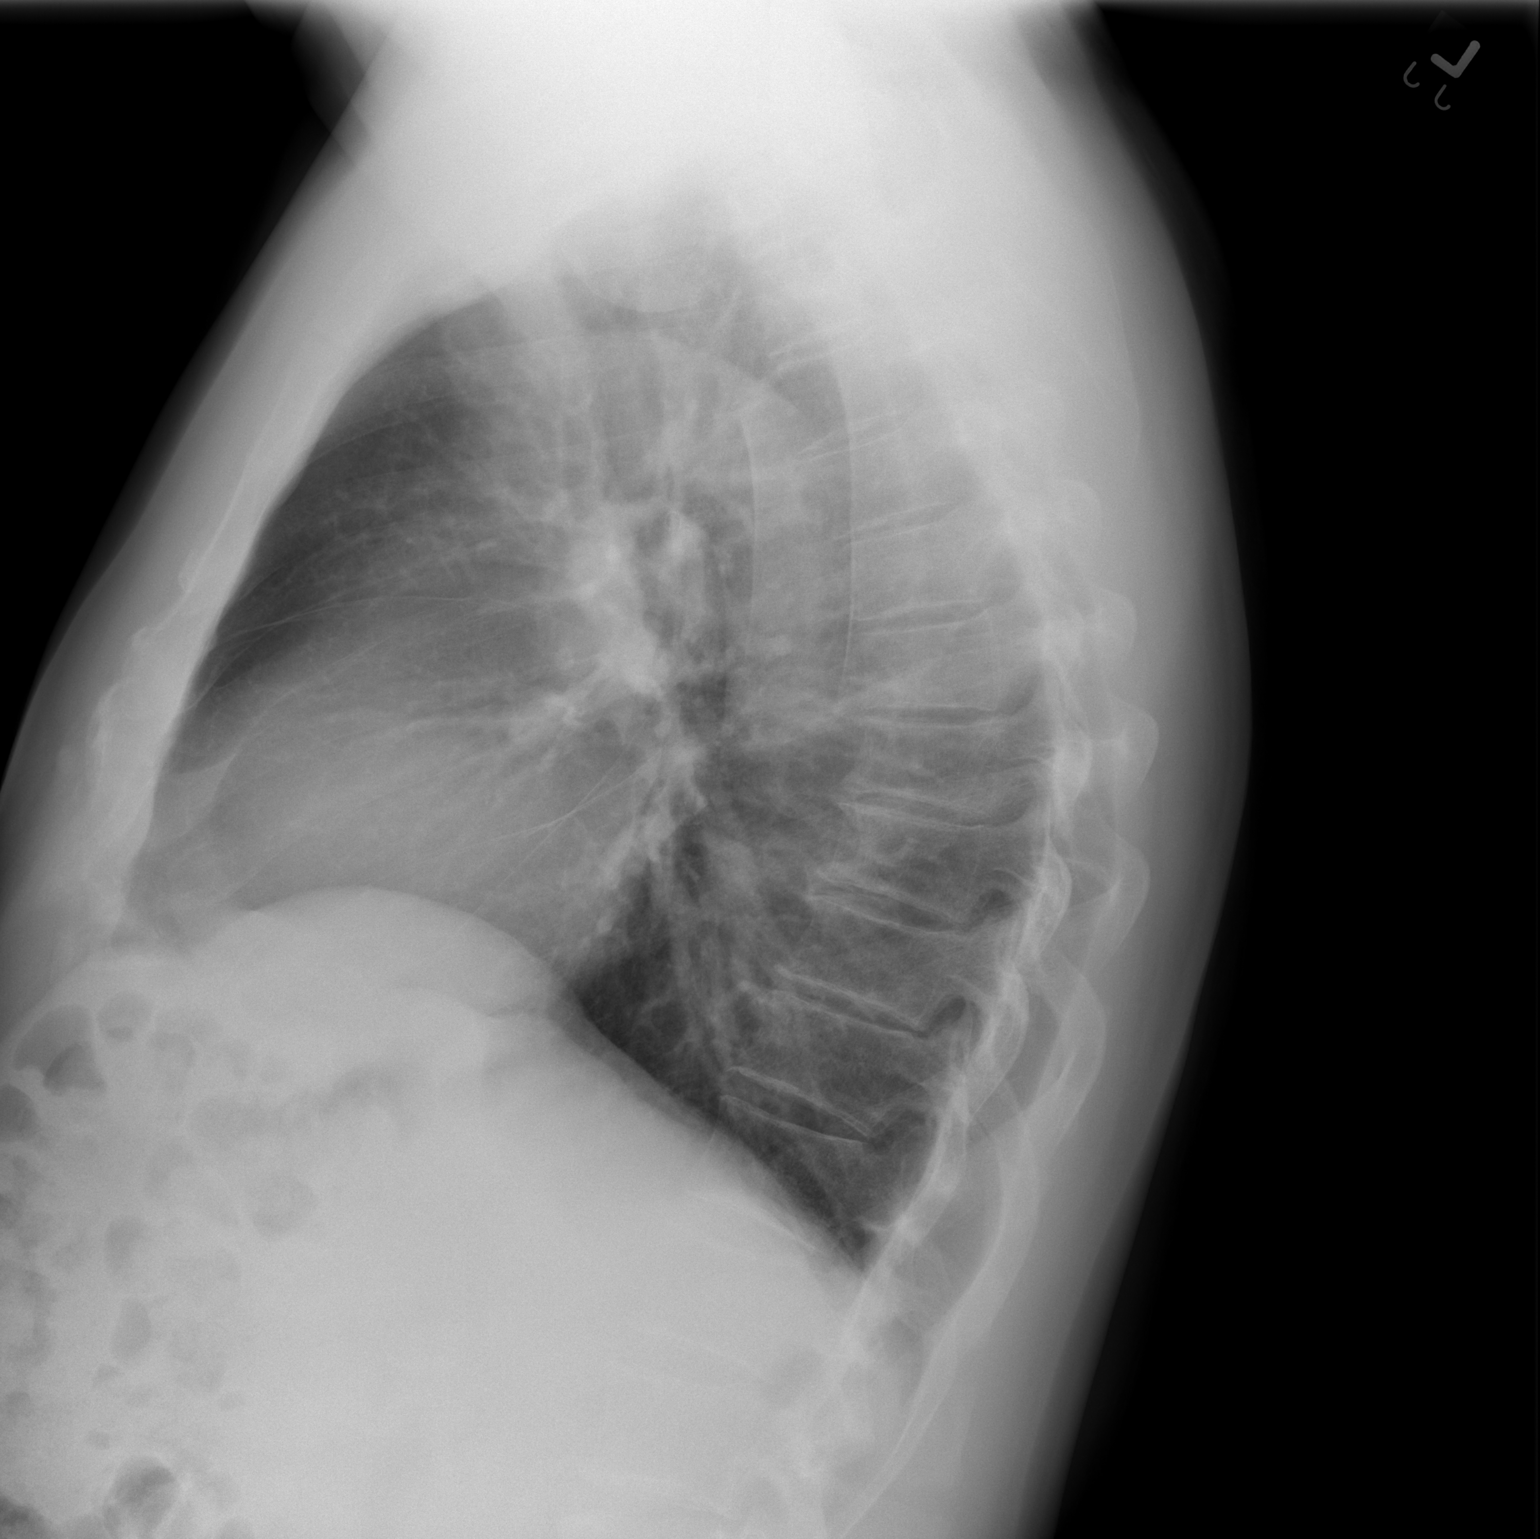

[2 of 2 positions shown; findings below may reference images not displayed]

FINDINGS: Heart size is normal.  Both lungs are clear.  There is no
evidence of pleural effusion.  No mass or adenopathy identified.
Calcified left hilar lymph nodes again seen, consistent with old
granulomatous disease.
IMPRESSION: Stable exam.  No active disease.

## 2011-05-26 LAB — DIFFERENTIAL
Basophils Relative: 3 — ABNORMAL HIGH
Eosinophils Absolute: 0.1
Lymphs Abs: 1.2
Monocytes Absolute: 0.1
Neutro Abs: 3.4
Neutrophils Relative %: 67

## 2011-05-26 LAB — RETICULOCYTES
RBC.: 2.31 — ABNORMAL LOW
Retic Ct Pct: 1.8

## 2011-05-26 LAB — BONE MARROW EXAM: Bone Marrow Exam: 209

## 2011-05-26 LAB — CBC
HCT: 26.3 — ABNORMAL LOW
MCHC: 34.6
Platelets: 267
RDW: 18.6 — ABNORMAL HIGH

## 2011-05-26 LAB — TISSUE HYBRIDIZATION (BONE MARROW)-NCBH

## 2011-05-30 LAB — COMPREHENSIVE METABOLIC PANEL
ALT: 70 — ABNORMAL HIGH
AST: 45 — ABNORMAL HIGH
Albumin: 3.2 — ABNORMAL LOW
Alkaline Phosphatase: 90
BUN: 16
CO2: 26
Calcium: 8.7
Creatinine, Ser: 0.94
Creatinine, Ser: 1.08
GFR calc Af Amer: >60
GFR calc non Af Amer: >60
Glucose, Bld: 114 — ABNORMAL HIGH
Potassium: 3.8
Sodium: 141
Total Protein: 6.8

## 2011-05-30 LAB — DIFFERENTIAL
Basophils Relative: 2 — ABNORMAL HIGH
Basophils Relative: 2 — ABNORMAL HIGH
Eosinophils Relative: 15 — ABNORMAL HIGH
Eosinophils Relative: 17 — ABNORMAL HIGH
Lymphocytes Relative: 27
Monocytes Relative: 5
Monocytes Relative: 6
Neutrophils Relative %: 53

## 2011-05-30 LAB — CBC
HCT: 24.3 — ABNORMAL LOW
HCT: 24.4 — ABNORMAL LOW
HCT: 25.1 — ABNORMAL LOW
HCT: 27.3 — ABNORMAL LOW
HCT: 27.6 — ABNORMAL LOW
Hemoglobin: 8.3 — ABNORMAL LOW
Hemoglobin: 8.3 — ABNORMAL LOW
Hemoglobin: 9.1 — ABNORMAL LOW
MCHC: 33.4
MCV: 109.1 — ABNORMAL HIGH
Platelets: 229
RBC: 2.25 — ABNORMAL LOW
RBC: 2.5 — ABNORMAL LOW
RDW: 24.3 — ABNORMAL HIGH
WBC: 3.3 — ABNORMAL LOW
WBC: 3.4 — ABNORMAL LOW
WBC: 3.5 — ABNORMAL LOW
WBC: 4

## 2011-05-30 LAB — CULTURE, BLOOD (ROUTINE X 2)
Culture: NO GROWTH
Culture: NO GROWTH

## 2011-05-30 LAB — BASIC METABOLIC PANEL
BUN: 10
Creatinine, Ser: 0.96
GFR calc non Af Amer: >60
Potassium: 4.1

## 2011-05-30 LAB — LACTATE DEHYDROGENASE: LDH: 172

## 2011-05-30 LAB — VANCOMYCIN, TROUGH: Vancomycin Tr: <5 — ABNORMAL LOW

## 2011-06-27 ENCOUNTER — Other Ambulatory Visit: Payer: Self-pay | Admitting: Obstetrics and Gynecology

## 2011-06-27 ENCOUNTER — Ambulatory Visit
Admission: RE | Admit: 2011-06-27 | Discharge: 2011-06-27 | Disposition: A | Discharge status: Home / Self Care | Payer: BC Managed Care – PPO | Source: Ambulatory Visit | Attending: Obstetrics and Gynecology | Admitting: Obstetrics and Gynecology

## 2011-06-27 DIAGNOSIS — T1490XA Injury, unspecified, initial encounter: Secondary | ICD-10-CM

## 2012-03-29 ENCOUNTER — Ambulatory Visit (INDEPENDENT_AMBULATORY_CARE_PROVIDER_SITE_OTHER): Payer: BC Managed Care – PPO | Admitting: Psychology

## 2012-03-29 DIAGNOSIS — F341 Dysthymic disorder: Secondary | ICD-10-CM

## 2012-04-04 ENCOUNTER — Ambulatory Visit (INDEPENDENT_AMBULATORY_CARE_PROVIDER_SITE_OTHER): Payer: BC Managed Care – PPO | Admitting: Psychology

## 2012-04-04 DIAGNOSIS — F341 Dysthymic disorder: Secondary | ICD-10-CM

## 2012-04-09 ENCOUNTER — Ambulatory Visit (INDEPENDENT_AMBULATORY_CARE_PROVIDER_SITE_OTHER): Payer: BC Managed Care – PPO | Admitting: Psychology

## 2012-04-09 DIAGNOSIS — F341 Dysthymic disorder: Secondary | ICD-10-CM

## 2012-04-13 ENCOUNTER — Ambulatory Visit: Payer: BC Managed Care – PPO | Admitting: Psychology

## 2012-04-19 ENCOUNTER — Ambulatory Visit (INDEPENDENT_AMBULATORY_CARE_PROVIDER_SITE_OTHER): Payer: BC Managed Care – PPO | Admitting: Psychology

## 2012-04-19 DIAGNOSIS — F341 Dysthymic disorder: Secondary | ICD-10-CM

## 2012-04-24 ENCOUNTER — Ambulatory Visit: Payer: BC Managed Care – PPO | Admitting: Psychology

## 2012-05-02 ENCOUNTER — Ambulatory Visit (INDEPENDENT_AMBULATORY_CARE_PROVIDER_SITE_OTHER): Payer: BC Managed Care – PPO | Admitting: Psychology

## 2012-05-02 DIAGNOSIS — F341 Dysthymic disorder: Secondary | ICD-10-CM

## 2012-05-09 ENCOUNTER — Ambulatory Visit (INDEPENDENT_AMBULATORY_CARE_PROVIDER_SITE_OTHER): Payer: BC Managed Care – PPO | Admitting: Psychology

## 2012-05-09 DIAGNOSIS — F341 Dysthymic disorder: Secondary | ICD-10-CM

## 2012-05-16 ENCOUNTER — Ambulatory Visit (INDEPENDENT_AMBULATORY_CARE_PROVIDER_SITE_OTHER): Payer: BC Managed Care – PPO | Admitting: Psychology

## 2012-05-16 DIAGNOSIS — F341 Dysthymic disorder: Secondary | ICD-10-CM

## 2012-05-25 ENCOUNTER — Ambulatory Visit (INDEPENDENT_AMBULATORY_CARE_PROVIDER_SITE_OTHER): Payer: BC Managed Care – PPO | Admitting: Psychology

## 2012-05-25 DIAGNOSIS — F341 Dysthymic disorder: Secondary | ICD-10-CM

## 2012-06-01 ENCOUNTER — Ambulatory Visit (INDEPENDENT_AMBULATORY_CARE_PROVIDER_SITE_OTHER): Payer: BC Managed Care – PPO | Admitting: Psychology

## 2012-06-01 DIAGNOSIS — F341 Dysthymic disorder: Secondary | ICD-10-CM

## 2012-06-08 ENCOUNTER — Ambulatory Visit: Payer: BC Managed Care – PPO | Admitting: Psychology

## 2012-06-14 ENCOUNTER — Ambulatory Visit (INDEPENDENT_AMBULATORY_CARE_PROVIDER_SITE_OTHER): Payer: BC Managed Care – PPO | Admitting: Psychology

## 2012-06-14 DIAGNOSIS — F341 Dysthymic disorder: Secondary | ICD-10-CM

## 2012-06-15 ENCOUNTER — Ambulatory Visit: Payer: BC Managed Care – PPO | Admitting: Psychology

## 2012-06-22 ENCOUNTER — Ambulatory Visit (INDEPENDENT_AMBULATORY_CARE_PROVIDER_SITE_OTHER): Payer: BC Managed Care – PPO | Admitting: Psychology

## 2012-06-22 DIAGNOSIS — F411 Generalized anxiety disorder: Secondary | ICD-10-CM

## 2012-07-06 ENCOUNTER — Ambulatory Visit (INDEPENDENT_AMBULATORY_CARE_PROVIDER_SITE_OTHER): Payer: BC Managed Care – PPO | Admitting: Psychology

## 2012-07-06 DIAGNOSIS — F341 Dysthymic disorder: Secondary | ICD-10-CM

## 2012-07-13 ENCOUNTER — Ambulatory Visit (INDEPENDENT_AMBULATORY_CARE_PROVIDER_SITE_OTHER): Payer: BC Managed Care – PPO | Admitting: Psychology

## 2012-07-13 DIAGNOSIS — F341 Dysthymic disorder: Secondary | ICD-10-CM

## 2012-07-20 ENCOUNTER — Ambulatory Visit (INDEPENDENT_AMBULATORY_CARE_PROVIDER_SITE_OTHER): Payer: BC Managed Care – PPO | Admitting: Psychology

## 2012-07-20 DIAGNOSIS — F341 Dysthymic disorder: Secondary | ICD-10-CM

## 2012-08-03 ENCOUNTER — Ambulatory Visit: Payer: BC Managed Care – PPO | Admitting: Psychology

## 2012-08-10 ENCOUNTER — Ambulatory Visit (INDEPENDENT_AMBULATORY_CARE_PROVIDER_SITE_OTHER): Payer: BC Managed Care – PPO | Admitting: Psychology

## 2012-08-10 DIAGNOSIS — F341 Dysthymic disorder: Secondary | ICD-10-CM

## 2012-08-17 ENCOUNTER — Ambulatory Visit (INDEPENDENT_AMBULATORY_CARE_PROVIDER_SITE_OTHER): Payer: BC Managed Care – PPO | Admitting: Psychology

## 2012-08-17 DIAGNOSIS — F341 Dysthymic disorder: Secondary | ICD-10-CM

## 2012-08-24 ENCOUNTER — Ambulatory Visit: Payer: BC Managed Care – PPO | Admitting: Psychology

## 2012-08-31 ENCOUNTER — Ambulatory Visit: Payer: BC Managed Care – PPO | Admitting: Psychology

## 2012-09-07 ENCOUNTER — Ambulatory Visit: Payer: BC Managed Care – PPO | Admitting: Psychology

## 2012-09-14 ENCOUNTER — Ambulatory Visit (INDEPENDENT_AMBULATORY_CARE_PROVIDER_SITE_OTHER): Payer: BC Managed Care – PPO | Admitting: Psychology

## 2012-09-14 DIAGNOSIS — F341 Dysthymic disorder: Secondary | ICD-10-CM

## 2012-09-21 ENCOUNTER — Ambulatory Visit (INDEPENDENT_AMBULATORY_CARE_PROVIDER_SITE_OTHER): Payer: BC Managed Care – PPO | Admitting: Psychology

## 2012-09-21 DIAGNOSIS — F341 Dysthymic disorder: Secondary | ICD-10-CM

## 2012-09-28 ENCOUNTER — Ambulatory Visit (INDEPENDENT_AMBULATORY_CARE_PROVIDER_SITE_OTHER): Payer: BC Managed Care – PPO | Admitting: Psychology

## 2012-09-28 DIAGNOSIS — F341 Dysthymic disorder: Secondary | ICD-10-CM

## 2012-10-05 ENCOUNTER — Ambulatory Visit: Payer: BC Managed Care – PPO | Admitting: Psychology

## 2012-10-19 ENCOUNTER — Ambulatory Visit (INDEPENDENT_AMBULATORY_CARE_PROVIDER_SITE_OTHER): Payer: BC Managed Care – PPO | Admitting: Psychology

## 2012-10-19 DIAGNOSIS — F341 Dysthymic disorder: Secondary | ICD-10-CM

## 2012-10-26 ENCOUNTER — Ambulatory Visit: Payer: BC Managed Care – PPO | Admitting: Psychology

## 2012-11-02 ENCOUNTER — Ambulatory Visit (INDEPENDENT_AMBULATORY_CARE_PROVIDER_SITE_OTHER): Payer: BC Managed Care – PPO | Admitting: Psychology

## 2012-11-02 DIAGNOSIS — F341 Dysthymic disorder: Secondary | ICD-10-CM

## 2012-11-09 ENCOUNTER — Ambulatory Visit (INDEPENDENT_AMBULATORY_CARE_PROVIDER_SITE_OTHER): Payer: BC Managed Care – PPO | Admitting: Psychology

## 2012-11-09 DIAGNOSIS — F341 Dysthymic disorder: Secondary | ICD-10-CM

## 2012-11-16 ENCOUNTER — Ambulatory Visit: Payer: BC Managed Care – PPO | Admitting: Psychology

## 2012-11-29 ENCOUNTER — Ambulatory Visit (INDEPENDENT_AMBULATORY_CARE_PROVIDER_SITE_OTHER): Payer: BC Managed Care – PPO | Admitting: Psychology

## 2012-11-29 DIAGNOSIS — F341 Dysthymic disorder: Secondary | ICD-10-CM

## 2012-12-07 ENCOUNTER — Ambulatory Visit: Payer: BC Managed Care – PPO | Admitting: Psychology

## 2012-12-21 ENCOUNTER — Ambulatory Visit: Payer: BC Managed Care – PPO | Admitting: Psychology

## 2013-01-11 ENCOUNTER — Ambulatory Visit (INDEPENDENT_AMBULATORY_CARE_PROVIDER_SITE_OTHER): Payer: BC Managed Care – PPO | Admitting: Psychology

## 2013-01-11 DIAGNOSIS — F341 Dysthymic disorder: Secondary | ICD-10-CM

## 2013-02-01 ENCOUNTER — Ambulatory Visit (INDEPENDENT_AMBULATORY_CARE_PROVIDER_SITE_OTHER): Payer: BC Managed Care – PPO | Admitting: Psychology

## 2013-02-01 DIAGNOSIS — F341 Dysthymic disorder: Secondary | ICD-10-CM

## 2013-02-12 ENCOUNTER — Ambulatory Visit (INDEPENDENT_AMBULATORY_CARE_PROVIDER_SITE_OTHER): Payer: BC Managed Care – PPO | Admitting: Psychology

## 2013-02-12 DIAGNOSIS — F341 Dysthymic disorder: Secondary | ICD-10-CM

## 2013-02-14 ENCOUNTER — Ambulatory Visit: Payer: BC Managed Care – PPO | Admitting: Psychology

## 2013-06-04 ENCOUNTER — Ambulatory Visit: Payer: BC Managed Care – PPO | Admitting: Psychology

## 2013-07-04 ENCOUNTER — Ambulatory Visit (INDEPENDENT_AMBULATORY_CARE_PROVIDER_SITE_OTHER): Payer: BC Managed Care – PPO | Admitting: Psychology

## 2013-07-04 DIAGNOSIS — F341 Dysthymic disorder: Secondary | ICD-10-CM

## 2013-07-18 DIAGNOSIS — D099 Carcinoma in situ, unspecified: Secondary | ICD-10-CM

## 2013-07-18 HISTORY — DX: Carcinoma in situ, unspecified: D09.9

## 2014-05-01 ENCOUNTER — Ambulatory Visit: Payer: BC Managed Care – PPO | Admitting: Psychology

## 2014-05-02 ENCOUNTER — Ambulatory Visit (INDEPENDENT_AMBULATORY_CARE_PROVIDER_SITE_OTHER): Payer: BC Managed Care – PPO | Admitting: Psychology

## 2014-05-02 DIAGNOSIS — F341 Dysthymic disorder: Secondary | ICD-10-CM

## 2014-09-03 ENCOUNTER — Other Ambulatory Visit: Payer: Self-pay | Admitting: Dermatology

## 2014-09-03 DIAGNOSIS — D229 Melanocytic nevi, unspecified: Secondary | ICD-10-CM

## 2014-09-03 HISTORY — DX: Melanocytic nevi, unspecified: D22.9

## 2014-12-11 ENCOUNTER — Other Ambulatory Visit: Payer: Self-pay | Admitting: Physician Assistant

## 2015-02-10 ENCOUNTER — Other Ambulatory Visit: Payer: Self-pay | Admitting: Oral Surgery

## 2016-07-06 ENCOUNTER — Other Ambulatory Visit: Payer: Self-pay | Admitting: Oral Surgery

## 2018-04-17 ENCOUNTER — Encounter (HOSPITAL_BASED_OUTPATIENT_CLINIC_OR_DEPARTMENT_OTHER): Payer: Medicare Other | Attending: Internal Medicine

## 2018-04-17 DIAGNOSIS — Y842 Radiological procedure and radiotherapy as the cause of abnormal reaction of the patient, or of later complication, without mention of misadventure at the time of the procedure: Secondary | ICD-10-CM | POA: Insufficient documentation

## 2018-04-17 DIAGNOSIS — Z85818 Personal history of malignant neoplasm of other sites of lip, oral cavity, and pharynx: Secondary | ICD-10-CM | POA: Insufficient documentation

## 2018-04-17 DIAGNOSIS — L598 Other specified disorders of the skin and subcutaneous tissue related to radiation: Secondary | ICD-10-CM | POA: Insufficient documentation

## 2018-06-28 ENCOUNTER — Other Ambulatory Visit (HOSPITAL_BASED_OUTPATIENT_CLINIC_OR_DEPARTMENT_OTHER): Payer: Self-pay | Admitting: Internal Medicine

## 2018-06-28 ENCOUNTER — Encounter (HOSPITAL_BASED_OUTPATIENT_CLINIC_OR_DEPARTMENT_OTHER): Payer: Medicare Other | Attending: Internal Medicine

## 2018-06-28 ENCOUNTER — Ambulatory Visit (HOSPITAL_COMMUNITY)
Admission: RE | Admit: 2018-06-28 | Discharge: 2018-06-28 | Disposition: A | Discharge status: Home / Self Care | Payer: Medicare Other | Source: Ambulatory Visit | Attending: Internal Medicine | Admitting: Internal Medicine

## 2018-06-28 DIAGNOSIS — M272 Inflammatory conditions of jaws: Secondary | ICD-10-CM | POA: Diagnosis present

## 2018-06-28 DIAGNOSIS — L598 Other specified disorders of the skin and subcutaneous tissue related to radiation: Secondary | ICD-10-CM | POA: Diagnosis not present

## 2018-06-28 DIAGNOSIS — Z85819 Personal history of malignant neoplasm of unspecified site of lip, oral cavity, and pharynx: Secondary | ICD-10-CM | POA: Diagnosis not present

## 2018-06-28 DIAGNOSIS — Z9289 Personal history of other medical treatment: Secondary | ICD-10-CM | POA: Diagnosis present

## 2018-06-28 DIAGNOSIS — Y842 Radiological procedure and radiotherapy as the cause of abnormal reaction of the patient, or of later complication, without mention of misadventure at the time of the procedure: Secondary | ICD-10-CM | POA: Diagnosis not present

## 2018-06-28 DIAGNOSIS — Z923 Personal history of irradiation: Secondary | ICD-10-CM | POA: Diagnosis not present

## 2018-06-28 DIAGNOSIS — Z1389 Encounter for screening for other disorder: Secondary | ICD-10-CM | POA: Diagnosis present

## 2018-06-28 DIAGNOSIS — Z79891 Long term (current) use of opiate analgesic: Secondary | ICD-10-CM | POA: Insufficient documentation

## 2018-06-28 DIAGNOSIS — Z9221 Personal history of antineoplastic chemotherapy: Secondary | ICD-10-CM | POA: Diagnosis not present

## 2018-07-03 ENCOUNTER — Encounter (HOSPITAL_BASED_OUTPATIENT_CLINIC_OR_DEPARTMENT_OTHER): Payer: Medicare Other | Attending: Internal Medicine

## 2018-07-03 DIAGNOSIS — M272 Inflammatory conditions of jaws: Secondary | ICD-10-CM | POA: Diagnosis not present

## 2018-07-03 DIAGNOSIS — Y842 Radiological procedure and radiotherapy as the cause of abnormal reaction of the patient, or of later complication, without mention of misadventure at the time of the procedure: Secondary | ICD-10-CM | POA: Insufficient documentation

## 2018-07-03 DIAGNOSIS — Z85818 Personal history of malignant neoplasm of other sites of lip, oral cavity, and pharynx: Secondary | ICD-10-CM | POA: Insufficient documentation

## 2018-07-04 DIAGNOSIS — M272 Inflammatory conditions of jaws: Secondary | ICD-10-CM | POA: Diagnosis not present

## 2018-07-05 ENCOUNTER — Encounter (HOSPITAL_BASED_OUTPATIENT_CLINIC_OR_DEPARTMENT_OTHER): Payer: Medicare Other | Attending: Internal Medicine

## 2018-07-05 DIAGNOSIS — Z85818 Personal history of malignant neoplasm of other sites of lip, oral cavity, and pharynx: Secondary | ICD-10-CM | POA: Insufficient documentation

## 2018-07-05 DIAGNOSIS — M272 Inflammatory conditions of jaws: Secondary | ICD-10-CM | POA: Diagnosis not present

## 2018-07-05 DIAGNOSIS — Y842 Radiological procedure and radiotherapy as the cause of abnormal reaction of the patient, or of later complication, without mention of misadventure at the time of the procedure: Secondary | ICD-10-CM | POA: Insufficient documentation

## 2018-07-05 DIAGNOSIS — L598 Other specified disorders of the skin and subcutaneous tissue related to radiation: Secondary | ICD-10-CM | POA: Insufficient documentation

## 2018-07-06 DIAGNOSIS — M272 Inflammatory conditions of jaws: Secondary | ICD-10-CM | POA: Diagnosis not present

## 2018-07-09 DIAGNOSIS — M272 Inflammatory conditions of jaws: Secondary | ICD-10-CM | POA: Diagnosis not present

## 2018-07-10 DIAGNOSIS — M272 Inflammatory conditions of jaws: Secondary | ICD-10-CM | POA: Diagnosis not present

## 2018-07-11 DIAGNOSIS — M272 Inflammatory conditions of jaws: Secondary | ICD-10-CM | POA: Diagnosis not present

## 2018-07-12 DIAGNOSIS — M272 Inflammatory conditions of jaws: Secondary | ICD-10-CM | POA: Diagnosis not present

## 2018-07-13 DIAGNOSIS — M272 Inflammatory conditions of jaws: Secondary | ICD-10-CM | POA: Diagnosis not present

## 2018-07-16 DIAGNOSIS — L598 Other specified disorders of the skin and subcutaneous tissue related to radiation: Secondary | ICD-10-CM | POA: Diagnosis not present

## 2018-07-16 DIAGNOSIS — Y842 Radiological procedure and radiotherapy as the cause of abnormal reaction of the patient, or of later complication, without mention of misadventure at the time of the procedure: Secondary | ICD-10-CM | POA: Diagnosis not present

## 2018-07-16 DIAGNOSIS — Z85818 Personal history of malignant neoplasm of other sites of lip, oral cavity, and pharynx: Secondary | ICD-10-CM | POA: Diagnosis not present

## 2018-07-16 DIAGNOSIS — M272 Inflammatory conditions of jaws: Secondary | ICD-10-CM | POA: Diagnosis present

## 2018-07-17 DIAGNOSIS — M272 Inflammatory conditions of jaws: Secondary | ICD-10-CM | POA: Diagnosis not present

## 2018-07-18 DIAGNOSIS — M272 Inflammatory conditions of jaws: Secondary | ICD-10-CM | POA: Diagnosis not present

## 2018-07-19 DIAGNOSIS — M272 Inflammatory conditions of jaws: Secondary | ICD-10-CM | POA: Diagnosis not present

## 2018-07-20 DIAGNOSIS — M272 Inflammatory conditions of jaws: Secondary | ICD-10-CM | POA: Diagnosis not present

## 2018-07-23 DIAGNOSIS — M272 Inflammatory conditions of jaws: Secondary | ICD-10-CM | POA: Diagnosis not present

## 2018-07-24 DIAGNOSIS — M272 Inflammatory conditions of jaws: Secondary | ICD-10-CM | POA: Diagnosis not present

## 2018-07-25 DIAGNOSIS — M272 Inflammatory conditions of jaws: Secondary | ICD-10-CM | POA: Diagnosis not present

## 2018-07-30 ENCOUNTER — Encounter (HOSPITAL_BASED_OUTPATIENT_CLINIC_OR_DEPARTMENT_OTHER): Payer: Medicare Other | Attending: Internal Medicine

## 2018-07-30 DIAGNOSIS — M272 Inflammatory conditions of jaws: Secondary | ICD-10-CM | POA: Diagnosis not present

## 2018-07-30 DIAGNOSIS — Y842 Radiological procedure and radiotherapy as the cause of abnormal reaction of the patient, or of later complication, without mention of misadventure at the time of the procedure: Secondary | ICD-10-CM | POA: Insufficient documentation

## 2018-07-31 DIAGNOSIS — M272 Inflammatory conditions of jaws: Secondary | ICD-10-CM | POA: Diagnosis not present

## 2018-08-01 DIAGNOSIS — M272 Inflammatory conditions of jaws: Secondary | ICD-10-CM | POA: Diagnosis not present

## 2018-08-02 ENCOUNTER — Encounter (HOSPITAL_BASED_OUTPATIENT_CLINIC_OR_DEPARTMENT_OTHER): Payer: Medicare Other | Attending: Internal Medicine

## 2018-08-02 DIAGNOSIS — Y842 Radiological procedure and radiotherapy as the cause of abnormal reaction of the patient, or of later complication, without mention of misadventure at the time of the procedure: Secondary | ICD-10-CM | POA: Insufficient documentation

## 2018-08-02 DIAGNOSIS — M272 Inflammatory conditions of jaws: Secondary | ICD-10-CM | POA: Diagnosis not present

## 2018-08-03 DIAGNOSIS — M272 Inflammatory conditions of jaws: Secondary | ICD-10-CM | POA: Diagnosis not present

## 2018-08-06 DIAGNOSIS — M272 Inflammatory conditions of jaws: Secondary | ICD-10-CM | POA: Diagnosis not present

## 2018-08-07 DIAGNOSIS — M272 Inflammatory conditions of jaws: Secondary | ICD-10-CM | POA: Diagnosis not present

## 2018-08-08 DIAGNOSIS — M272 Inflammatory conditions of jaws: Secondary | ICD-10-CM | POA: Diagnosis not present

## 2018-08-14 DIAGNOSIS — M272 Inflammatory conditions of jaws: Secondary | ICD-10-CM | POA: Diagnosis not present

## 2018-08-15 DIAGNOSIS — M272 Inflammatory conditions of jaws: Secondary | ICD-10-CM | POA: Diagnosis not present

## 2018-08-16 DIAGNOSIS — Y842 Radiological procedure and radiotherapy as the cause of abnormal reaction of the patient, or of later complication, without mention of misadventure at the time of the procedure: Secondary | ICD-10-CM | POA: Diagnosis not present

## 2018-08-16 DIAGNOSIS — M272 Inflammatory conditions of jaws: Secondary | ICD-10-CM | POA: Diagnosis present

## 2018-08-17 DIAGNOSIS — M272 Inflammatory conditions of jaws: Secondary | ICD-10-CM | POA: Diagnosis not present

## 2018-08-20 DIAGNOSIS — M272 Inflammatory conditions of jaws: Secondary | ICD-10-CM | POA: Diagnosis not present

## 2018-08-23 DIAGNOSIS — M272 Inflammatory conditions of jaws: Secondary | ICD-10-CM | POA: Diagnosis not present

## 2018-08-24 DIAGNOSIS — M272 Inflammatory conditions of jaws: Secondary | ICD-10-CM | POA: Diagnosis not present

## 2018-08-27 DIAGNOSIS — M272 Inflammatory conditions of jaws: Secondary | ICD-10-CM | POA: Diagnosis not present

## 2018-08-28 DIAGNOSIS — M272 Inflammatory conditions of jaws: Secondary | ICD-10-CM | POA: Diagnosis not present

## 2018-08-30 ENCOUNTER — Encounter (HOSPITAL_BASED_OUTPATIENT_CLINIC_OR_DEPARTMENT_OTHER): Payer: Medicare Other | Attending: Internal Medicine

## 2018-08-30 DIAGNOSIS — M272 Inflammatory conditions of jaws: Secondary | ICD-10-CM | POA: Insufficient documentation

## 2018-09-05 DIAGNOSIS — M272 Inflammatory conditions of jaws: Secondary | ICD-10-CM | POA: Diagnosis not present

## 2018-09-06 DIAGNOSIS — M272 Inflammatory conditions of jaws: Secondary | ICD-10-CM | POA: Diagnosis not present

## 2018-09-10 DIAGNOSIS — M272 Inflammatory conditions of jaws: Secondary | ICD-10-CM | POA: Diagnosis not present

## 2018-09-11 DIAGNOSIS — M272 Inflammatory conditions of jaws: Secondary | ICD-10-CM | POA: Diagnosis not present

## 2018-09-12 DIAGNOSIS — M272 Inflammatory conditions of jaws: Secondary | ICD-10-CM | POA: Diagnosis not present

## 2019-02-03 ENCOUNTER — Other Ambulatory Visit: Payer: Self-pay

## 2019-02-03 ENCOUNTER — Encounter (HOSPITAL_COMMUNITY): Payer: Self-pay | Admitting: Emergency Medicine

## 2019-02-03 DIAGNOSIS — K5903 Drug induced constipation: Secondary | ICD-10-CM | POA: Insufficient documentation

## 2019-02-03 DIAGNOSIS — D46C Myelodysplastic syndrome with isolated del(5q) chromosomal abnormality: Secondary | ICD-10-CM | POA: Insufficient documentation

## 2019-02-03 DIAGNOSIS — K5900 Constipation, unspecified: Secondary | ICD-10-CM | POA: Diagnosis present

## 2019-02-03 NOTE — ED Triage Notes (Signed)
Pt presents by [facility] for evaluation of constipation for the last 4 days without any relief from laxatives and enemas. Pt has hx of cancer. Not currently on any chemotherapy. Primary cancer care at [facility]

## 2019-02-04 ENCOUNTER — Emergency Department (HOSPITAL_COMMUNITY)
Admission: EM | Admit: 2019-02-04 | Discharge: 2019-02-04 | Disposition: A | Discharge status: Home / Self Care | Payer: Medicare Other | Attending: Emergency Medicine | Admitting: Emergency Medicine

## 2019-02-04 DIAGNOSIS — K5903 Drug induced constipation: Secondary | ICD-10-CM | POA: Diagnosis not present

## 2019-02-04 HISTORY — DX: Tinnitus, bilateral: H93.13

## 2019-02-04 HISTORY — DX: Testicular hypofunction: E29.1

## 2019-02-04 HISTORY — DX: Urge incontinence: N39.41

## 2019-02-04 HISTORY — DX: Cyst of kidney, acquired: N28.1

## 2019-02-04 HISTORY — DX: Candidal stomatitis: B37.0

## 2019-02-04 HISTORY — DX: Presence of intraocular lens: Z96.1

## 2019-02-04 HISTORY — DX: Elevated prostate specific antigen (PSA): R97.20

## 2019-02-04 HISTORY — DX: Age-related nuclear cataract, unspecified eye: H25.10

## 2019-02-04 HISTORY — DX: Neoplasm of unspecified behavior of other genitourinary organ: D49.59

## 2019-02-04 HISTORY — DX: Other specified disorders of the skin and subcutaneous tissue related to radiation: L59.8

## 2019-02-04 HISTORY — DX: Radiological procedure and radiotherapy as the cause of abnormal reaction of the patient, or of later complication, without mention of misadventure at the time of the procedure: Y84.2

## 2019-02-04 HISTORY — DX: Inflammatory conditions of jaws: M27.2

## 2019-02-04 HISTORY — DX: Unspecified sensorineural hearing loss: H90.5

## 2019-02-04 HISTORY — DX: Myelodysplastic syndrome with isolated del(5q) chromosomal abnormality: D46.C

## 2019-02-04 HISTORY — DX: Unspecified osteoarthritis, unspecified site: M19.90

## 2019-02-04 HISTORY — DX: Chronic fatigue, unspecified: R53.82

## 2019-02-04 MED ORDER — MINERAL OIL RE ENEM: 1.0000 | ENEMA | Freq: Once | RECTAL | Status: DC

## 2019-02-04 MED ORDER — MINERAL OIL RE ENEM
1.0000 | ENEMA | Freq: Once | RECTAL | Status: AC
  Administered 2019-02-04: 1 via RECTAL
  Filled 2019-02-04: qty 1

## 2019-02-04 NOTE — ED Provider Notes (Signed)
[facility] DEPT Provider Note   CSN: [id number] Arrival date & time: 02/03/19  2219    History   Chief Complaint Chief Complaint  Patient presents with  . Constipation    HPI Randall Rollins is a 68 y.o. male.     The history is provided by the patient.  Constipation  Severity:  Moderate Time since last bowel movement:  5 days Timing:  Constant Progression:  Worsening Chronicity:  New Context: narcotics   Stool description:  None produced Relieved by:  Nothing Worsened by:  Nothing Associated symptoms: no abdominal pain, no diarrhea, no fever, no flatus, no urinary retention and no vomiting    Patient with history of myelodysplastic syndrome, chronic fatigue, on chronic pain medication for constipation.  He reports he had a bowel movement in 5 days.  No fever/vomiting.  No abdominal pain.  He reports he has had this previously due to pain medication Past Medical History:  Diagnosis Date  . Chronic fatigue   . Cyst of kidney, acquired   . Elevated PSA   . Hypogonadism in male   . 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 Nuclear sclerosis   . Oropharyngeal candidiasis   . Osteoarthritis   . Osteoradionecrosis of jaw   . Prostate tumor   . Pseudophakia   . Sensorineural hearing loss   . Soft tissue radionecrosis   . Tinnitus aurium, bilateral   . Urge incontinence     There are no active problems to display for this patient.   Past Surgical History:  Procedure Laterality Date  . ESOPHAGOSCOPY WITH DILITATION          Home Medications    Prior to Admission medications   Not on File    Family History History reviewed. No pertinent family history.  Social History Social History   Tobacco Use  . Smoking status: Never Smoker  . Smokeless tobacco: Never Used  Substance Use Topics  . Alcohol use: Never    Frequency: Never  . Drug use: Never     Allergies   Aprepitant; Citalopram; Fosaprepitant;  Gabapentin; Barbiturates; Penicillins; Sulfa antibiotics; and Sulfamethoxazole   Review of Systems Review of Systems  Constitutional: Negative for fever.  Gastrointestinal: Positive for constipation. Negative for abdominal pain, diarrhea, flatus and vomiting.  Genitourinary: Negative for difficulty urinating.  All other systems reviewed and are negative.    Physical Exam Updated Vital Signs BP 138/88 (BP Location: Right Leg)   Pulse 89   Temp 98 F (36.7 C) (Oral)   Resp 18   Ht 1.727 m (5\' 8" )   Wt 63.5 kg   SpO2 99%   BMI 21.29 kg/m   Physical Exam  CONSTITUTIONAL: Well developed/well nourished HEAD: Normocephalic/atraumatic EYES: EOMI/PERRL ENMT: mask in place NECK: supple no meningeal signs SPINE/BACK:entire spine nontender CV: S1/S2 noted, no murmurs/rubs/gallops noted LUNGS: Lungs are clear to auscultation bilaterally, no apparent distress ABDOMEN: soft, nontender, no rebound or guarding, bowel sounds noted throughout abdomen Rectal - stool impaction noted, no blood/melena, no mass/abscess, nurse present GU:no cva tenderness NEURO: Pt is awake/alert/appropriate, moves all extremitiesx4.  No facial droop.   EXTREMITIES: pulses normal/equal, full ROM SKIN: warm, color normal PSYCH: no abnormalities of mood noted, alert and oriented to situation  ED Treatments / Results  Labs (all labs ordered are listed, but only abnormal results are displayed) Labs Reviewed - No data to display  EKG None  Radiology No results found.  Procedures Fecal disimpaction  Date/Time: 02/04/2019 1:27 AM Performed by: Ripley Fraise, MD Authorized by: Ripley Fraise, MD  Consent: Verbal consent obtained. Patient identity confirmed: verbally with patient Local anesthesia used: no  Anesthesia: Local anesthesia used: no  Sedation: Patient sedated: no  Patient tolerance: Patient tolerated the procedure well with no immediate complications Comments: Fecal disimpaction  attempted.  Only partial disimpaction completed. No immediate complications     Medications Ordered in ED Medications  mineral oil enema 1 enema (1 enema Rectal Given 02/04/19 0110)     Initial Impression / Assessment and Plan / ED Course  I have reviewed the triage vital signs and the nursing notes.          1:28 AM Patient with minimal improvement with fecal disimpaction.  Will try enema    Pt now requesting discharge He reports he feels mildly improved but prefers to go home He is in no distress He has no abdominal pain He will continue home medications for constipation We discussed return precautions Final Clinical Impressions(s) / ED Diagnoses   Final diagnoses:  Drug-induced constipation    ED Discharge Orders    None       Ripley Fraise, MD 02/04/19 [phone]

## 2019-02-04 NOTE — ED Notes (Signed)
Pt contracted shingles around April 1. Lesions on left hand appear to be closed.

## 2019-02-04 NOTE — ED Notes (Signed)
Pt aggitated when tech answered call bell. Stated he wanted to leave because they are not fixing him correctly

## 2019-03-30 DEATH — deceased
